# Patient Record
Sex: Male | Born: 1965 | Race: White | Hispanic: No | Marital: Single | Smoking: Current every day smoker
Health system: Southern US, Community
[De-identification: ages and names within clinical notes are randomized; demographics above are authoritative.]

## PROBLEM LIST (undated history)

## (undated) DIAGNOSIS — I10 Essential (primary) hypertension: Secondary | ICD-10-CM

## (undated) DIAGNOSIS — K219 Gastro-esophageal reflux disease without esophagitis: Secondary | ICD-10-CM

## (undated) DIAGNOSIS — F32A Depression, unspecified: Secondary | ICD-10-CM

## (undated) DIAGNOSIS — F419 Anxiety disorder, unspecified: Secondary | ICD-10-CM

## (undated) DIAGNOSIS — M549 Dorsalgia, unspecified: Secondary | ICD-10-CM

## (undated) DIAGNOSIS — N200 Calculus of kidney: Secondary | ICD-10-CM

## (undated) DIAGNOSIS — F329 Major depressive disorder, single episode, unspecified: Secondary | ICD-10-CM

## (undated) HISTORY — PX: APPENDECTOMY: SHX54

## (undated) HISTORY — PX: KNEE ARTHROSCOPY W/ MENISCAL REPAIR: SHX1877

---

## 2015-02-25 ENCOUNTER — Emergency Department (HOSPITAL_COMMUNITY)
Admission: EM | Admit: 2015-02-25 | Discharge: 2015-02-25 | Disposition: A | Discharge status: Home / Self Care | Payer: BLUE CROSS/BLUE SHIELD | Source: Home / Self Care | Attending: Family Medicine | Admitting: Family Medicine

## 2015-02-25 ENCOUNTER — Encounter (HOSPITAL_COMMUNITY): Payer: Self-pay | Admitting: Emergency Medicine

## 2015-02-25 DIAGNOSIS — M545 Low back pain, unspecified: Secondary | ICD-10-CM

## 2015-02-25 MED ORDER — KETOROLAC TROMETHAMINE 60 MG/2ML IM SOLN
INTRAMUSCULAR | Status: AC
  Filled 2015-02-25: qty 2

## 2015-02-25 MED ORDER — METHYLPREDNISOLONE ACETATE 80 MG/ML IJ SUSP
80.0000 mg | Freq: Once | INTRAMUSCULAR | Status: AC
  Administered 2015-02-25: 80 mg via INTRAMUSCULAR

## 2015-02-25 MED ORDER — METHYLPREDNISOLONE ACETATE 80 MG/ML IJ SUSP
INTRAMUSCULAR | Status: AC
  Filled 2015-02-25: qty 1

## 2015-02-25 MED ORDER — KETOROLAC TROMETHAMINE 60 MG/2ML IM SOLN
60.0000 mg | Freq: Once | INTRAMUSCULAR | Status: AC
  Administered 2015-02-25: 60 mg via INTRAMUSCULAR

## 2015-02-25 MED ORDER — HYDROCODONE-ACETAMINOPHEN 5-325 MG PO TABS: 1.0000 | ORAL_TABLET | Freq: Four times a day (QID) | ORAL | Status: DC | PRN

## 2015-02-25 MED ORDER — MELOXICAM 15 MG PO TABS: 15.0000 mg | ORAL_TABLET | Freq: Every day | ORAL | Status: DC

## 2015-02-25 MED ORDER — CYCLOBENZAPRINE HCL 10 MG PO TABS: 10.0000 mg | ORAL_TABLET | Freq: Every day | ORAL | Status: DC

## 2015-02-25 MED ORDER — TETANUS-DIPHTH-ACELL PERTUSSIS 5-2.5-18.5 LF-MCG/0.5 IM SUSP: 0.5000 mL | Freq: Once | INTRAMUSCULAR | Status: DC
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
## 2015-02-25 NOTE — Discharge Instructions (Signed)
Rest for 48 hours with gentle leg range of motion.  Think about taking up swimming several times a week to keep this from recurring in the future.

## 2015-02-25 NOTE — ED Notes (Signed)
C/o lower back pain onset 1-3 hours ago Reports he felt a pull when getting out of shower and bending over Taking Ibup 800 mg and left flexeril w/no relief Alert, no signs of acute distress.

## 2015-09-19 ENCOUNTER — Encounter (HOSPITAL_COMMUNITY): Payer: Self-pay | Admitting: Emergency Medicine

## 2015-09-19 ENCOUNTER — Emergency Department (HOSPITAL_COMMUNITY)
Admission: EM | Admit: 2015-09-19 | Discharge: 2015-09-19 | Disposition: A | Discharge status: Home / Self Care | Payer: BLUE CROSS/BLUE SHIELD | Source: Home / Self Care | Attending: Family Medicine | Admitting: Family Medicine

## 2015-09-19 DIAGNOSIS — M545 Low back pain, unspecified: Secondary | ICD-10-CM

## 2015-09-19 HISTORY — DX: Essential (primary) hypertension: I10

## 2015-09-19 HISTORY — DX: Gastro-esophageal reflux disease without esophagitis: K21.9

## 2015-09-19 HISTORY — DX: Major depressive disorder, single episode, unspecified: F32.9

## 2015-09-19 HISTORY — DX: Anxiety disorder, unspecified: F41.9

## 2015-09-19 HISTORY — DX: Dorsalgia, unspecified: M54.9

## 2015-09-19 HISTORY — DX: Depression, unspecified: F32.A

## 2015-09-19 MED ORDER — CYCLOBENZAPRINE HCL 5 MG PO TABS: 5.0000 mg | ORAL_TABLET | Freq: Three times a day (TID) | ORAL | Status: DC | PRN

## 2015-09-19 MED ORDER — KETOROLAC TROMETHAMINE 60 MG/2ML IM SOLN
INTRAMUSCULAR | Status: AC
  Filled 2015-09-19: qty 2

## 2015-09-19 MED ORDER — KETOROLAC TROMETHAMINE 60 MG/2ML IM SOLN
60.0000 mg | Freq: Once | INTRAMUSCULAR | Status: AC
  Administered 2015-09-19: 60 mg via INTRAMUSCULAR

## 2015-09-19 MED ORDER — MELOXICAM 7.5 MG PO TABS: 7.5000 mg | ORAL_TABLET | Freq: Two times a day (BID) | ORAL | Status: DC

## 2015-09-19 NOTE — Discharge Instructions (Signed)
Use medicine as prescribed and see orthopedist if further problems. °

## 2015-09-19 NOTE — ED Provider Notes (Signed)
CSN: [id number]     Arrival date & time 09/19/15  1339 History   First MD Initiated Contact with Patient 09/19/15 1537     Chief Complaint  Patient presents with  . Back Pain   (Consider location/radiation/quality/duration/timing/severity/associated sxs/prior Treatment) Patient is a 49 y.o. male presenting with back pain. The history is provided by the patient.  Back Pain Location:  Lumbar spine Quality:  Stiffness and cramping Radiates to:  Does not radiate Pain severity:  Mild Onset quality:  Sudden Duration:  1 day Progression:  Worsening Chronicity:  Chronic Context: physical stress   Context comment:  Sitting a lot at school. Relieved by:  None tried Worsened by:  Nothing tried Ineffective treatments:  None tried Associated symptoms: no abdominal pain, no abdominal swelling, no bladder incontinence, no bowel incontinence, no fever, no leg pain, no paresthesias, no pelvic pain and no perianal numbness   Risk factors: lack of exercise     Past Medical History  Diagnosis Date  . Hypertension   . Back pain   . Depression   . Anxiety   . GERD (gastroesophageal reflux disease)    Past Surgical History  Procedure Laterality Date  . Appendectomy    . Knee arthroscopy w/ meniscal repair Bilateral    No family history on file. Social History  Substance Use Topics  . Smoking status: Current Every Day Smoker  . Smokeless tobacco: None  . Alcohol Use: No     Comment: vaporizes with nicotine    Review of Systems  Constitutional: Negative.  Negative for fever.  Gastrointestinal: Negative.  Negative for abdominal pain and bowel incontinence.  Genitourinary: Negative.  Negative for bladder incontinence and pelvic pain.  Musculoskeletal: Positive for back pain. Negative for joint swelling and gait problem.  Skin: Negative.   Neurological: Negative.  Negative for paresthesias.  All other systems reviewed and are negative.   Allergies  Contrast media and Sulfa  antibiotics  Home Medications   Prior to Admission medications   Medication Sig Start Date End Date Taking? Authorizing Provider  amitriptyline (ELAVIL) 50 MG tablet Take 50 mg by mouth at bedtime.    Historical Provider, MD  aspirin EC 81 MG tablet Take 81 mg by mouth daily.    Historical Provider, MD  clonazePAM (KLONOPIN) 0.5 MG tablet Take 0.5 mg by mouth 2 (two) times daily as needed for anxiety.    Historical Provider, MD  cyclobenzaprine (FLEXERIL) 10 MG tablet Take 1 tablet (10 mg total) by mouth at bedtime. 02/25/15   Elvina SidleKurt Lauenstein, MD  HYDROcodone-acetaminophen (NORCO) 5-325 MG per tablet Take 1 tablet by mouth every 6 (six) hours as needed for moderate pain. 02/25/15   Elvina SidleKurt Lauenstein, MD  lisinopril (PRINIVIL,ZESTRIL) 10 MG tablet Take 10 mg by mouth daily.    Historical Provider, MD  meloxicam (MOBIC) 15 MG tablet Take 1 tablet (15 mg total) by mouth daily. 02/25/15   Elvina SidleKurt Lauenstein, MD  omeprazole (PRILOSEC) 40 MG capsule Take 40 mg by mouth daily.    Historical Provider, MD  venlafaxine XR (EFFEXOR-XR) 150 MG 24 hr capsule Take 150 mg by mouth daily with breakfast.    Historical Provider, MD   Meds Ordered and Administered this Visit  Medications - No data to display  BP 126/85 mmHg  Pulse 105  Temp(Src) 97.7 F (36.5 C) (Oral)  Resp 16  SpO2 97% No data found.   Physical Exam  Constitutional: He is oriented to person, place, and time. He appears well-developed and  well-nourished.  Abdominal: Soft. Bowel sounds are normal.  Musculoskeletal: He exhibits tenderness.       Lumbar back: He exhibits decreased range of motion, tenderness, bony tenderness, pain and spasm. He exhibits normal pulse.       Back:  Neurological: He is alert and oriented to person, place, and time.  Skin: Skin is warm and dry.  Nursing note and vitals reviewed.   ED Course  Procedures (including critical care time)  Labs Review Labs Reviewed - No data to display  Imaging Review No  results found.   Visual Acuity Review  Right Eye Distance:   Left Eye Distance:   Bilateral Distance:    Right Eye Near:   Left Eye Near:    Bilateral Near:         MDM  No diagnosis found. rx for mobic and flexeril.    Linna Hoff, MD 09/19/15 [phone]

## 2015-09-19 NOTE — ED Notes (Signed)
Reports recurrent back spasms.  Onset this morning of this episode of back pain.  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  Patient lives in [place], but has some physicians in [place].

## 2016-05-21 ENCOUNTER — Ambulatory Visit (HOSPITAL_COMMUNITY)
Admission: EM | Admit: 2016-05-21 | Discharge: 2016-05-21 | Disposition: A | Discharge status: Nursing Home (Includes ICF) | Payer: BLUE CROSS/BLUE SHIELD | Attending: Family Medicine | Admitting: Family Medicine

## 2016-05-21 ENCOUNTER — Emergency Department (HOSPITAL_COMMUNITY): Payer: BLUE CROSS/BLUE SHIELD

## 2016-05-21 ENCOUNTER — Encounter (HOSPITAL_COMMUNITY): Payer: Self-pay | Admitting: *Deleted

## 2016-05-21 ENCOUNTER — Encounter (HOSPITAL_COMMUNITY): Payer: Self-pay | Admitting: Emergency Medicine

## 2016-05-21 ENCOUNTER — Emergency Department (HOSPITAL_COMMUNITY)
Admission: EM | Admit: 2016-05-21 | Discharge: 2016-05-22 | Disposition: A | Discharge status: Home / Self Care | Payer: BLUE CROSS/BLUE SHIELD | Attending: Emergency Medicine | Admitting: Emergency Medicine

## 2016-05-21 DIAGNOSIS — M545 Low back pain, unspecified: Secondary | ICD-10-CM

## 2016-05-21 DIAGNOSIS — Z7982 Long term (current) use of aspirin: Secondary | ICD-10-CM | POA: Insufficient documentation

## 2016-05-21 DIAGNOSIS — Z79899 Other long term (current) drug therapy: Secondary | ICD-10-CM | POA: Insufficient documentation

## 2016-05-21 DIAGNOSIS — R109 Unspecified abdominal pain: Secondary | ICD-10-CM

## 2016-05-21 DIAGNOSIS — F17200 Nicotine dependence, unspecified, uncomplicated: Secondary | ICD-10-CM | POA: Diagnosis not present

## 2016-05-21 DIAGNOSIS — I10 Essential (primary) hypertension: Secondary | ICD-10-CM | POA: Insufficient documentation

## 2016-05-21 DIAGNOSIS — F329 Major depressive disorder, single episode, unspecified: Secondary | ICD-10-CM | POA: Diagnosis not present

## 2016-05-21 HISTORY — DX: Calculus of kidney: N20.0

## 2016-05-21 LAB — URINALYSIS, ROUTINE W REFLEX MICROSCOPIC
Bilirubin Urine: NEGATIVE
GLUCOSE, UA: NEGATIVE mg/dL
HGB URINE DIPSTICK: NEGATIVE
Ketones, ur: NEGATIVE mg/dL
Leukocytes, UA: NEGATIVE
Nitrite: NEGATIVE
Protein, ur: NEGATIVE mg/dL
SPECIFIC GRAVITY, URINE: 1.017 (ref 1.005–1.030)
pH: 5.5 (ref 5.0–8.0)

## 2016-05-21 LAB — CBC
HEMATOCRIT: 42.8 % (ref 39.0–52.0)
Hemoglobin: 14.8 g/dL (ref 13.0–17.0)
MCH: 30.8 pg (ref 26.0–34.0)
MCHC: 34.6 g/dL (ref 30.0–36.0)
MCV: 89 fL (ref 78.0–100.0)
PLATELETS: 247 10*3/uL (ref 150–400)
RBC: 4.81 MIL/uL (ref 4.22–5.81)
RDW: 12.4 % (ref 11.5–15.5)
WBC: 7.5 10*3/uL (ref 4.0–10.5)

## 2016-05-21 LAB — COMPREHENSIVE METABOLIC PANEL
ALBUMIN: 4.1 g/dL (ref 3.5–5.0)
ALT: 19 U/L (ref 17–63)
ANION GAP: 7 (ref 5–15)
AST: 21 U/L (ref 15–41)
Alkaline Phosphatase: 40 U/L (ref 38–126)
BUN: 17 mg/dL (ref 6–20)
CHLORIDE: 104 mmol/L (ref 101–111)
CO2: 26 mmol/L (ref 22–32)
Calcium: 9.5 mg/dL (ref 8.9–10.3)
Creatinine, Ser: 1.02 mg/dL (ref 0.61–1.24)
GFR calc Af Amer: >60 mL/min (ref >60)
GFR calc non Af Amer: >60 mL/min (ref >60)
GLUCOSE: 92 mg/dL (ref 65–99)
Potassium: 4.5 mmol/L (ref 3.5–5.1)
SODIUM: 137 mmol/L (ref 135–145)
Total Bilirubin: 0.5 mg/dL (ref 0.3–1.2)
Total Protein: 7 g/dL (ref 6.5–8.1)

## 2016-05-21 LAB — POCT URINALYSIS DIP (DEVICE)
BILIRUBIN URINE: NEGATIVE
Glucose, UA: NEGATIVE mg/dL
Hgb urine dipstick: NEGATIVE
Ketones, ur: NEGATIVE mg/dL
Leukocytes, UA: NEGATIVE
NITRITE: NEGATIVE
PH: 5.5 (ref 5.0–8.0)
PROTEIN: NEGATIVE mg/dL
Specific Gravity, Urine: 1.025 (ref 1.005–1.030)
UROBILINOGEN UA: 0.2 mg/dL (ref 0.0–1.0)

## 2016-05-21 MED ORDER — HYDROMORPHONE HCL 1 MG/ML IJ SOLN
1.0000 mg | Freq: Once | INTRAMUSCULAR | Status: AC
  Administered 2016-05-21: 1 mg via INTRAVENOUS
  Filled 2016-05-21: qty 1

## 2016-05-21 MED ORDER — ONDANSETRON HCL 4 MG/2ML IJ SOLN
4.0000 mg | Freq: Once | INTRAMUSCULAR | Status: AC
  Administered 2016-05-21: 4 mg via INTRAVENOUS
  Filled 2016-05-21: qty 2

## 2016-05-21 MED ORDER — KETOROLAC TROMETHAMINE 60 MG/2ML IM SOLN
INTRAMUSCULAR | Status: AC
  Filled 2016-05-21: qty 2

## 2016-05-21 MED ORDER — KETOROLAC TROMETHAMINE 60 MG/2ML IM SOLN
60.0000 mg | Freq: Once | INTRAMUSCULAR | Status: AC
  Administered 2016-05-21: 60 mg via INTRAMUSCULAR

## 2016-05-21 MED ORDER — SODIUM CHLORIDE 0.9 % IV SOLN
Freq: Once | INTRAVENOUS | Status: AC
  Administered 2016-05-21: via INTRAVENOUS

## 2016-05-21 NOTE — ED Provider Notes (Signed)
[facility]    CSN: [id number] Arrival date & time: 05/21/16  1531  First Provider Contact:  First MD Initiated Contact with Patient 05/21/16 1659        History   Chief Complaint Chief Complaint  Patient presents with  . Flank Pain    HPI Ryan Duffy is a 50 y.o. male.    Flank Pain  This is a new problem. The current episode started 6 to 12 hours ago (h/o 3 kidney stones, last in 2009, [place] The problem has been gradually worsening. Associated symptoms include abdominal pain.    Past Medical History:  Diagnosis Date  . Anxiety   . Back pain   . Depression   . GERD (gastroesophageal reflux disease)   . Hypertension     There are no active problems to display for this patient.   Past Surgical History:  Procedure Laterality Date  . APPENDECTOMY    . KNEE ARTHROSCOPY W/ MENISCAL REPAIR Bilateral        Home Medications    Prior to Admission medications   Medication Sig Start Date End Date Taking? Authorizing Provider  amitriptyline (ELAVIL) 50 MG tablet Take 50 mg by mouth at bedtime.    Historical Provider, MD  aspirin EC 81 MG tablet Take 81 mg by mouth daily.    Historical Provider, MD  clonazePAM (KLONOPIN) 0.5 MG tablet Take 0.5 mg by mouth 2 (two) times daily as needed for anxiety.    Historical Provider, MD  cyclobenzaprine (FLEXERIL) 5 MG tablet Take 1 tablet (5 mg total) by mouth 3 (three) times daily as needed for muscle spasms. 09/19/15   Linna Hoff, MD  HYDROcodone-acetaminophen (NORCO) 5-325 MG per tablet Take 1 tablet by mouth every 6 (six) hours as needed for moderate pain. 02/25/15   Elvina Sidle, MD  lisinopril (PRINIVIL,ZESTRIL) 10 MG tablet Take 10 mg by mouth daily.    Historical Provider, MD  meloxicam (MOBIC) 7.5 MG tablet Take 1 tablet (7.5 mg total) by mouth 2 (two) times daily after a meal. 09/19/15   Linna Hoff, MD  omeprazole (PRILOSEC) 40 MG capsule Take 40 mg by mouth daily.    Historical Provider,  MD  venlafaxine XR (EFFEXOR-XR) 150 MG 24 hr capsule Take 150 mg by mouth daily with breakfast.    Historical Provider, MD    Family History History reviewed. No pertinent family history.  Social History Social History  Substance Use Topics  . Smoking status: Current Every Day Smoker  . Smokeless tobacco: Not on file  . Alcohol use No     Comment: vaporizes with nicotine     Allergies   Contrast media [iodinated diagnostic agents] and Sulfa antibiotics   Review of Systems Review of Systems  Constitutional: Negative for fever.  Gastrointestinal: Positive for abdominal pain. Negative for nausea and vomiting.  Genitourinary: Positive for flank pain. Negative for dysuria and hematuria.  All other systems reviewed and are negative.    Physical Exam Triage Vital Signs ED Triage Vitals  Enc Vitals Group     BP 05/21/16 1629 110/89     Pulse Rate 05/21/16 1629 83     Resp 05/21/16 1629 16     Temp 05/21/16 1629 98.5 F (36.9 C)     Temp Source 05/21/16 1629 Oral     SpO2 05/21/16 1629 100 %     Weight --      Height --      Head Circumference --  Peak Flow --      Pain Score 05/21/16 1639 9     Pain Loc --      Pain Edu? --      Excl. in GC? --    No data found.   Updated Vital Signs BP 110/89 (BP Location: Left Arm)   Pulse 83   Temp 98.5 F (36.9 C) (Oral)   Resp 16   SpO2 100%   Visual Acuity Right Eye Distance:   Left Eye Distance:   Bilateral Distance:    Right Eye Near:   Left Eye Near:    Bilateral Near:     Physical Exam  Constitutional: He is oriented to person, place, and time. He appears well-developed and well-nourished. He appears distressed.  Neck: Normal range of motion. Neck supple.  Cardiovascular: Regular rhythm and normal heart sounds.   Pulmonary/Chest: Effort normal and breath sounds normal.  Abdominal: Soft. Bowel sounds are normal. He exhibits no mass. There is tenderness. There is CVA tenderness. There is no rigidity and  no guarding.  Lymphadenopathy:    He has no cervical adenopathy.  Neurological: He is alert and oriented to person, place, and time.  Skin: Skin is warm and dry.  Nursing note and vitals reviewed.    UC Treatments / Results  Labs (all labs ordered are listed, but only abnormal results are displayed) Labs Reviewed  POCT URINALYSIS DIP (DEVICE)    EKG  EKG Interpretation None       Radiology No results found.  Procedures Procedures (including critical care time)  Medications Ordered in UC Medications - No data to display   Initial Impression / Assessment and Plan / UC Course  I have reviewed the triage vital signs and the nursing notes.  Pertinent labs & imaging results that were available during my care of the patient were reviewed by me and considered in my medical decision making (see chart for details).  Clinical Course  flank pain with neg u/a since this am, h/o 3 stones, sent for ct eval.    Final Clinical Impressions(s) / UC Diagnoses   Final diagnoses:  None    New Prescriptions New Prescriptions   No medications on file     Linna Hoff, MD 05/21/16 1715

## 2016-05-21 NOTE — ED Provider Notes (Signed)
[facility] Note   CSN: [id number] Arrival date & time: 05/21/16  1803  First Provider Contact:  First MD Initiated Contact with Patient 05/21/16 2205        History   Chief Complaint Chief Complaint  Patient presents with  . Flank Pain    HPI Ryan Duffy is a 50 y.o. male.  This a 50 year old male with a history of kidney stones almost always on the left .  He has never been able to pass them on his own.  I was requiring surgical intervention.  Today, developed right flank pain that radiates to his right upper abdomen causes nausea.  The pain waxes and wanes in intensity but never goes away.  He went to urgent care who evaluated him, gave him an injection of Toradol and sent him to the ED for further evaluation.      Past Medical History:  Diagnosis Date  . Anxiety   . Back pain   . Depression   . GERD (gastroesophageal reflux disease)   . Hypertension   . Renal stones     There are no active problems to display for this patient.   Past Surgical History:  Procedure Laterality Date  . APPENDECTOMY    . KNEE ARTHROSCOPY W/ MENISCAL REPAIR Bilateral        Home Medications    Prior to Admission medications   Medication Sig Start Date End Date Taking? Authorizing Provider  amitriptyline (ELAVIL) 50 MG tablet Take 50 mg by mouth at bedtime.    Historical Provider, MD  aspirin EC 81 MG tablet Take 81 mg by mouth daily.    Historical Provider, MD  clonazePAM (KLONOPIN) 0.5 MG tablet Take 0.5 mg by mouth 2 (two) times daily as needed for anxiety.    Historical Provider, MD  cyclobenzaprine (FLEXERIL) 5 MG tablet Take 1 tablet (5 mg total) by mouth 3 (three) times daily as needed for muscle spasms. 09/19/15   Linna Hoff, MD  diclofenac (VOLTAREN) 75 MG EC tablet Take 1 tablet (75 mg total) by mouth 2 (two) times daily. 05/22/16   Earley Favor, NP  HYDROcodone-acetaminophen (NORCO) 5-325 MG tablet Take 1 tablet by mouth every 6 (six) hours as needed  for moderate pain. 05/22/16   Earley Favor, NP  HYDROcodone-acetaminophen (NORCO/VICODIN) 5-325 MG tablet Take 1 tablet by mouth every 6 (six) hours as needed for severe pain. 05/22/16   Earley Favor, NP  lisinopril (PRINIVIL,ZESTRIL) 10 MG tablet Take 10 mg by mouth daily.    Historical Provider, MD  meloxicam (MOBIC) 7.5 MG tablet Take 1 tablet (7.5 mg total) by mouth 2 (two) times daily after a meal. 09/19/15   Linna Hoff, MD  metaxalone (SKELAXIN) 800 MG tablet Take 1 tablet (800 mg total) by mouth 3 (three) times daily. 05/22/16   Earley Favor, NP  omeprazole (PRILOSEC) 40 MG capsule Take 40 mg by mouth daily.    Historical Provider, MD  venlafaxine XR (EFFEXOR-XR) 150 MG 24 hr capsule Take 150 mg by mouth daily with breakfast.    Historical Provider, MD    Family History No family history on file.  Social History Social History  Substance Use Topics  . Smoking status: Current Every Day Smoker  . Smokeless tobacco: Not on file  . Alcohol use No     Comment: vaporizes with nicotine     Allergies   Ivp dye [iodinated diagnostic agents]; Sulfamethoxazole; Tramadol; and Sulfa antibiotics   Review of Systems Review  of Systems  Constitutional: Negative for fever.  Gastrointestinal: Positive for abdominal pain and nausea.  Genitourinary: Positive for flank pain. Negative for hematuria.  All other systems reviewed and are negative.    Physical Exam Updated Vital Signs BP 131/91   Pulse 65   Temp 98.2 F (36.8 C) (Oral)   Resp 22   SpO2 99%   Physical Exam  Constitutional: He appears well-developed and well-nourished.  HENT:  Head: Normocephalic.  Eyes: Pupils are equal, round, and reactive to light.  Neck: Normal range of motion.  Cardiovascular: Normal rate.   Pulmonary/Chest: Effort normal.  Abdominal: Soft. He exhibits no distension. There is no tenderness.  Musculoskeletal: Normal range of motion.  Neurological: He is alert.  Skin: Skin is warm.  Psychiatric: He has  a normal mood and affect.  Nursing note and vitals reviewed.    ED Treatments / Results  Labs (all labs ordered are listed, but only abnormal results are displayed) Labs Reviewed  CBC  COMPREHENSIVE METABOLIC PANEL  URINALYSIS, ROUTINE W REFLEX MICROSCOPIC (NOT AT [facility])    EKG  EKG Interpretation None       Radiology Ct Renal Stone Study  Result Date: 05/22/2016 CLINICAL DATA:  Right flank pain with nausea for 1 day. EXAM: CT ABDOMEN AND PELVIS WITHOUT CONTRAST TECHNIQUE: Multidetector CT imaging of the abdomen and pelvis was performed following the standard protocol without IV contrast. COMPARISON:  None. FINDINGS: Lower chest:  The included lung bases are clear. Liver: No focal lesion allowing for lack contrast. Hepatobiliary: Gallbladder physiologically distended, no calcified stone. No biliary dilatation. Pancreas: No ductal dilatation or inflammation. Spleen: Normal. Adrenal glands: No nodule. Kidneys: Symmetric in size without stones or hydronephrosis. There is no perinephric stranding. Both ureters are decompressed without stones along the course. Stomach/Bowel: Stomach physiologically distended. There are no dilated or thickened small bowel loops. Moderate volume of stool throughout the colon without colonic wall thickening. Post appendectomy with surgical clips. Vascular/Lymphatic: No retroperitoneal adenopathy. Abdominal aorta is normal in caliber. Trace atherosclerosis of the abdominal aorta. Reproductive: No acute abnormality. Bladder: Minimally distended, no bladder stone. Other: No free air, free fluid, or intra-abdominal fluid collection. Phlebolith in the left pelvis. Musculoskeletal: There are no acute or suspicious osseous abnormalities. Scattered degenerative disc disease in the spine. IMPRESSION: No renal stones or obstructive uropathy. No acute abnormality in the abdomen/pelvis. Electronically Signed   By: Rubye Oaks M.D.   On: 05/22/2016 00:48     Procedures Procedures (including critical care time)  Medications Ordered in ED Medications  0.9 %  sodium chloride infusion ( Intravenous New Bag/Given 05/21/16 2342)  ondansetron (ZOFRAN) injection 4 mg (4 mg Intravenous Given 05/21/16 2343)  HYDROmorphone (DILAUDID) injection 1 mg (1 mg Intravenous Given 05/21/16 2344)     Initial Impression / Assessment and Plan / ED Course  I have reviewed the triage vital signs and the nursing notes.  Pertinent labs & imaging results that were available during my care of the patient were reviewed by me and considered in my medical decision making (see chart for details).  Clinical Course     Patient's labs have been reviewed all within normal parameters.  Due to patient's history and physical examination renal stone study will be obtained.  He will be given additional pain medication, which will include Dilaudid and Zofran as he is still quite uncomfortable and nauseated Patient's CT scan reveals no stone all his other internal organs appear to be within normal parameters.  This has  been discussed with the patient.  He will be given a muscle relaxer, anti-inflammatory 10 Vicodin pills for severe pain, and he will follow-up with his primary care physician  Final Clinical Impressions(s) / ED Diagnoses   Final diagnoses:  Flank pain  Right flank pain    New Prescriptions New Prescriptions   DICLOFENAC (VOLTAREN) 75 MG EC TABLET    Take 1 tablet (75 mg total) by mouth 2 (two) times daily.   HYDROCODONE-ACETAMINOPHEN (NORCO/VICODIN) 5-325 MG TABLET    Take 1 tablet by mouth every 6 (six) hours as needed for severe pain.   METAXALONE (SKELAXIN) 800 MG TABLET    Take 1 tablet (800 mg total) by mouth 3 (three) times daily.     Earley Favor, NP 05/21/16 2232    Earley Favor, NP 05/22/16 0132    Earley Favor, NP 05/22/16 0132    Tilden Fossa, MD 05/23/16 1350

## 2016-05-21 NOTE — ED Triage Notes (Signed)
Pt  Reports  r  Flank  Pain  With  Nausea     X  About  6  Hours

## 2016-05-21 NOTE — ED Notes (Signed)
Report  Phoned  To  lori  Nurse   First   

## 2016-05-21 NOTE — ED Triage Notes (Signed)
Pt. reports right flank pain with nausea onset today , pain similar to his kidney stone in the past , denies hematuria or dysuria , no fever or chills.

## 2016-05-21 NOTE — ED Notes (Signed)
Pt. advised nurse that he is leaving and  upset due to long wait , nurse explained delay , wait time and process but left dissatisfied.

## 2016-05-22 MED ORDER — HYDROCODONE-ACETAMINOPHEN 5-325 MG PO TABS: 1.0000 | ORAL_TABLET | Freq: Four times a day (QID) | ORAL | 0 refills | Status: DC | PRN

## 2016-05-22 MED ORDER — METAXALONE 800 MG PO TABS: 800.0000 mg | ORAL_TABLET | Freq: Three times a day (TID) | ORAL | 0 refills | Status: DC

## 2016-05-22 MED ORDER — DICLOFENAC SODIUM 75 MG PO TBEC: 75.0000 mg | DELAYED_RELEASE_TABLET | Freq: Two times a day (BID) | ORAL | 0 refills | Status: DC

## 2016-05-22 NOTE — ED Notes (Signed)
Pt to CT at this time.

## 2016-05-22 NOTE — Discharge Instructions (Signed)
As discussed, your CT scan shows normal kidneys.  No renal stone normal gallbladder, pancreas, intestinal walls, stomach, bladder.  No sign of constipation.  Your blood work reveals normal results that indicating any gallbladder disease, pancreatitis, signs of infection.  Urine was without sign of infection.  I cannot explain your discomfort you're beginning an anti-inflammatory as well as a muscle relaxer that she can use on a daily basis for the next several days.  You've also been given a prescription for Vicodin that she can use for severe pain if needed.  I recommend you follow-up with your primary care physician for further evaluation

## 2016-09-05 ENCOUNTER — Ambulatory Visit (INDEPENDENT_AMBULATORY_CARE_PROVIDER_SITE_OTHER): Payer: Self-pay | Admitting: Licensed Clinical Social Worker

## 2016-09-05 DIAGNOSIS — F331 Major depressive disorder, recurrent, moderate: Secondary | ICD-10-CM

## 2016-09-10 ENCOUNTER — Ambulatory Visit (INDEPENDENT_AMBULATORY_CARE_PROVIDER_SITE_OTHER): Payer: Self-pay | Admitting: Licensed Clinical Social Worker

## 2016-09-10 DIAGNOSIS — F331 Major depressive disorder, recurrent, moderate: Secondary | ICD-10-CM

## 2016-10-03 ENCOUNTER — Ambulatory Visit: Payer: BLUE CROSS/BLUE SHIELD | Admitting: Licensed Clinical Social Worker

## 2017-04-10 ENCOUNTER — Encounter (HOSPITAL_COMMUNITY): Payer: Self-pay

## 2017-04-10 ENCOUNTER — Emergency Department (HOSPITAL_COMMUNITY): Payer: BLUE CROSS/BLUE SHIELD

## 2017-04-10 ENCOUNTER — Emergency Department (HOSPITAL_COMMUNITY)
Admission: EM | Admit: 2017-04-10 | Discharge: 2017-04-11 | Disposition: A | Discharge status: Home / Self Care | Payer: BLUE CROSS/BLUE SHIELD | Attending: Emergency Medicine | Admitting: Emergency Medicine

## 2017-04-10 DIAGNOSIS — I10 Essential (primary) hypertension: Secondary | ICD-10-CM | POA: Diagnosis not present

## 2017-04-10 DIAGNOSIS — Y939 Activity, unspecified: Secondary | ICD-10-CM | POA: Diagnosis not present

## 2017-04-10 DIAGNOSIS — F17200 Nicotine dependence, unspecified, uncomplicated: Secondary | ICD-10-CM | POA: Insufficient documentation

## 2017-04-10 DIAGNOSIS — R1010 Upper abdominal pain, unspecified: Secondary | ICD-10-CM | POA: Diagnosis not present

## 2017-04-10 DIAGNOSIS — Z79899 Other long term (current) drug therapy: Secondary | ICD-10-CM | POA: Diagnosis not present

## 2017-04-10 DIAGNOSIS — R079 Chest pain, unspecified: Secondary | ICD-10-CM | POA: Diagnosis not present

## 2017-04-10 DIAGNOSIS — R51 Headache: Secondary | ICD-10-CM | POA: Insufficient documentation

## 2017-04-10 DIAGNOSIS — S2220XA Unspecified fracture of sternum, initial encounter for closed fracture: Secondary | ICD-10-CM | POA: Diagnosis not present

## 2017-04-10 DIAGNOSIS — Y92410 Unspecified street and highway as the place of occurrence of the external cause: Secondary | ICD-10-CM | POA: Insufficient documentation

## 2017-04-10 DIAGNOSIS — Y999 Unspecified external cause status: Secondary | ICD-10-CM | POA: Insufficient documentation

## 2017-04-10 DIAGNOSIS — S29001A Unspecified injury of muscle and tendon of front wall of thorax, initial encounter: Secondary | ICD-10-CM | POA: Diagnosis present

## 2017-04-10 LAB — I-STAT CHEM 8, ED
BUN: 11 mg/dL (ref 6–20)
CALCIUM ION: 1.12 mmol/L — AB (ref 1.15–1.40)
Chloride: 99 mmol/L — ABNORMAL LOW (ref 101–111)
Creatinine, Ser: 0.9 mg/dL (ref 0.61–1.24)
GLUCOSE: 108 mg/dL — AB (ref 65–99)
HCT: 44 % (ref 39.0–52.0)
HEMOGLOBIN: 15 g/dL (ref 13.0–17.0)
Potassium: 4.2 mmol/L (ref 3.5–5.1)
Sodium: 138 mmol/L (ref 135–145)
TCO2: 27 mmol/L (ref 0–100)

## 2017-04-10 LAB — COMPREHENSIVE METABOLIC PANEL
ALBUMIN: 3.6 g/dL (ref 3.5–5.0)
ALK PHOS: 44 U/L (ref 38–126)
ALT: 33 U/L (ref 17–63)
AST: 34 U/L (ref 15–41)
Anion gap: 5 (ref 5–15)
BUN: 9 mg/dL (ref 6–20)
CALCIUM: 9.1 mg/dL (ref 8.9–10.3)
CO2: 28 mmol/L (ref 22–32)
CREATININE: 0.91 mg/dL (ref 0.61–1.24)
Chloride: 103 mmol/L (ref 101–111)
GFR calc Af Amer: >60 mL/min (ref >60)
GFR calc non Af Amer: >60 mL/min (ref >60)
Glucose, Bld: 109 mg/dL — ABNORMAL HIGH (ref 65–99)
Potassium: 4.3 mmol/L (ref 3.5–5.1)
SODIUM: 136 mmol/L (ref 135–145)
Total Bilirubin: 0.4 mg/dL (ref 0.3–1.2)
Total Protein: 7 g/dL (ref 6.5–8.1)

## 2017-04-10 LAB — CBC
HCT: 43.1 % (ref 39.0–52.0)
HEMOGLOBIN: 14.6 g/dL (ref 13.0–17.0)
MCH: 29.5 pg (ref 26.0–34.0)
MCHC: 33.9 g/dL (ref 30.0–36.0)
MCV: 87.1 fL (ref 78.0–100.0)
Platelets: 266 10*3/uL (ref 150–400)
RBC: 4.95 MIL/uL (ref 4.22–5.81)
RDW: 12.7 % (ref 11.5–15.5)
WBC: 12.6 10*3/uL — ABNORMAL HIGH (ref 4.0–10.5)

## 2017-04-10 MED ORDER — ONDANSETRON HCL 4 MG/2ML IJ SOLN
4.0000 mg | Freq: Once | INTRAMUSCULAR | Status: AC
  Administered 2017-04-10: 4 mg via INTRAVENOUS
  Filled 2017-04-10: qty 2

## 2017-04-10 MED ORDER — DIPHENHYDRAMINE HCL 50 MG/ML IJ SOLN
50.0000 mg | Freq: Once | INTRAMUSCULAR | Status: AC
  Administered 2017-04-11: 50 mg via INTRAVENOUS
  Filled 2017-04-10: qty 1

## 2017-04-10 MED ORDER — HYDROMORPHONE HCL 1 MG/ML IJ SOLN
1.0000 mg | Freq: Once | INTRAMUSCULAR | Status: AC
  Administered 2017-04-10: 1 mg via INTRAVENOUS
  Filled 2017-04-10: qty 1

## 2017-04-10 MED ORDER — HYDROCORTISONE NA SUCCINATE PF 100 MG IJ SOLR: 400.0000 mg | Freq: Once | INTRAMUSCULAR | Status: DC

## 2017-04-10 MED ORDER — HYDROCORTISONE NA SUCCINATE PF 250 MG IJ SOLR
200.0000 mg | Freq: Once | INTRAMUSCULAR | Status: AC
  Administered 2017-04-10: 200 mg via INTRAVENOUS
  Filled 2017-04-10: qty 200

## 2017-04-10 MED ORDER — DIPHENHYDRAMINE HCL 25 MG PO CAPS: 50.0000 mg | ORAL_CAPSULE | Freq: Once | ORAL | Status: AC

## 2017-04-10 NOTE — ED Notes (Addendum)
Dr. Patria Maneampos Cleared pt's C-Spine and told RN to remove C-Collar
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
## 2017-04-10 NOTE — ED Notes (Signed)
Patient transported to CT 

## 2017-04-10 NOTE — ED Notes (Signed)
Pain med given 

## 2017-04-11 ENCOUNTER — Emergency Department (HOSPITAL_COMMUNITY): Payer: BLUE CROSS/BLUE SHIELD

## 2017-04-11 DIAGNOSIS — S2220XA Unspecified fracture of sternum, initial encounter for closed fracture: Secondary | ICD-10-CM | POA: Diagnosis not present

## 2017-04-11 MED ORDER — OXYCODONE-ACETAMINOPHEN 5-325 MG PO TABS: 1.0000 | ORAL_TABLET | Freq: Four times a day (QID) | ORAL | 0 refills | Status: DC | PRN

## 2017-04-11 MED ORDER — IOPAMIDOL (ISOVUE-300) INJECTION 61%
INTRAVENOUS | Status: AC
  Administered 2017-04-11: 100 mL
  Filled 2017-04-11: qty 100

## 2017-04-11 MED ORDER — OXYCODONE-ACETAMINOPHEN 5-325 MG PO TABS
1.0000 | ORAL_TABLET | Freq: Once | ORAL | Status: AC
  Administered 2017-04-11: 1 via ORAL
  Filled 2017-04-11: qty 1

## 2017-04-11 MED ORDER — MELOXICAM 15 MG PO TABS: 15.0000 mg | ORAL_TABLET | Freq: Every day | ORAL | 0 refills | Status: DC
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
## 2017-04-11 NOTE — ED Notes (Signed)
Pt understood dc material. Scripts given at dc. NAD noted 

## 2018-01-27 ENCOUNTER — Encounter: Payer: Self-pay | Admitting: Neurology

## 2018-02-14 ENCOUNTER — Encounter: Payer: Self-pay | Admitting: Neurology

## 2018-05-12 ENCOUNTER — Ambulatory Visit (INDEPENDENT_AMBULATORY_CARE_PROVIDER_SITE_OTHER): Payer: Commercial Managed Care - PPO | Admitting: Neurology

## 2018-05-12 ENCOUNTER — Other Ambulatory Visit: Payer: Self-pay

## 2018-05-12 ENCOUNTER — Encounter: Payer: Self-pay | Admitting: Neurology

## 2018-05-12 VITALS — BP 118/76 | HR 114 | Ht 70.0 in | Wt 186.0 lb

## 2018-05-12 DIAGNOSIS — M545 Low back pain, unspecified: Secondary | ICD-10-CM

## 2018-05-12 DIAGNOSIS — G8929 Other chronic pain: Secondary | ICD-10-CM | POA: Diagnosis not present
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
## 2018-05-12 NOTE — Patient Instructions (Signed)
I don't think we are missing anything in regards to some peripheral nerve causing the pain.  I don't think a nerve conduction study will be helpful  I would continue working with pain management.  I will refer you to Dr. Antoine PrimasZachary Smith at [facility] who does perform OMM, which I think would be helpful.  Follow up as needed.
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
## 2018-06-02 ENCOUNTER — Encounter: Payer: Self-pay | Admitting: Family Medicine

## 2018-06-02 ENCOUNTER — Ambulatory Visit (INDEPENDENT_AMBULATORY_CARE_PROVIDER_SITE_OTHER): Payer: Commercial Managed Care - PPO | Admitting: Family Medicine

## 2018-06-02 VITALS — BP 110/82 | HR 115 | Ht 70.0 in | Wt 187.0 lb

## 2018-06-02 DIAGNOSIS — M533 Sacrococcygeal disorders, not elsewhere classified: Secondary | ICD-10-CM | POA: Insufficient documentation

## 2018-06-02 DIAGNOSIS — M999 Biomechanical lesion, unspecified: Secondary | ICD-10-CM | POA: Diagnosis not present

## 2018-06-02 NOTE — Assessment & Plan Note (Signed)
Sacroiliac Joint Mobilization and Rehab 1. Work on pretzel stretching, shoulder back and leg draped in front. 3-5 sets, 30 sec.. 2. hip abductor rotations. standing, hip flexion and rotation outward then inward. 3 sets, 15 reps. when can do comfortably, add ankle weights starting at 2 pounds.  3. cross over stretching - shoulder back to ground, same side leg crossover. 3-5 sets for 30 min..  4. rolling up and back knees to chest and rocking. 5. sacral tilt - 5 sets, hold for 5-10 seconds Return to clinic in 4 weeks 

## 2018-06-02 NOTE — Assessment & Plan Note (Signed)
Decision today to treat with OMT was based on Physical Exam  After verbal consent patient was treated with HVLA, ME, FPR techniques in cervical, thoracic, lumbar and sacral areas  Patient tolerated the procedure well with improvement in symptoms  Patient given exercises, stretches and lifestyle modifications  See medications in patient instructions if given  Patient will follow up in 4-6 weeks 

## 2018-06-02 NOTE — Patient Instructions (Addendum)
Good to see you.  Ice 20 minutes 2 times daily. Usually after activity and before bed. Exercises 3 times a week.  Keep monitor at eye level Tennis ball between shoulder blades with sitting.  Vitamin D 2000 IU daily  turmeric 500 mg daily  See me again in 4 weeks

## 2018-06-30 ENCOUNTER — Ambulatory Visit: Payer: Self-pay | Admitting: Family Medicine

## 2018-07-04 NOTE — Progress Notes (Signed)
[facility] [address] [place], [place] [address] Phone: [phone] Subjective:    I [name] am serving as a Neurosurgeon for Dr. Antoine Primas.   I'm seeing this patient by the request  of:  CC: Right sacroiliac follow-up  BJY:NWGNFAOZHY  Ryan Duffy is a 52 y.o. male coming in with complaint of SI joint pain. States that he feels about the same today. Most of his pain is on the right.  We attempted osteopathic manipulation with no significant benefit.  Patient does state that he has had some difficulty though working out on a regular basis and did not do the exercises regularly but most of this was secondary to stress and pain.      Previous imaging I have the MRI from July 2018 Mild degenerative disc disease with posterior broad-based disc bulge resulting in moderate left and mild right lateral recess stenosis at L5-S1 with disc material contacting the descending left S1 nerve root within the lateral recess.   No substantial central or foraminal stenosis.  Past Medical History:  Diagnosis Date  . Anxiety   . Back pain   . Depression   . GERD (gastroesophageal reflux disease)   . Hypertension   . Renal stones    Past Surgical History:  Procedure Laterality Date  . APPENDECTOMY    . KNEE ARTHROSCOPY W/ MENISCAL REPAIR Bilateral    Social History   Socioeconomic History  . Marital status: Single    Spouse name: Not on file  . Number of children: Not on file  . Years of education: Not on file  . Highest education level: Master's degree (e.g., MA, MS, MEng, MEd, MSW, MBA)  Occupational History  . 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 Financial resource strain: Not on file  . Food insecurity:    Worry: Not on file    Inability: Not on file  . Transportation needs:    Medical: Not on file    Non-medical: Not on file  Tobacco Use  . Smoking status: Current Every Day Smoker    Types: E-cigarettes  .  Smokeless tobacco: Never Used  Substance and Sexual Activity  . Alcohol use: No    Comment: vaporizes with nicotine  . Drug use: No  . Sexual activity: Not on file  Lifestyle  . Physical activity:    Days per week: Not on file    Minutes per session: Not on file  . Stress: Not on file  Relationships  . Social connections:    Talks on phone: Not on file    Gets together: Not on file    Attends religious service: Not on file    Active member of club or organization: Not on file    Attends meetings of clubs or organizations: Not on file    Relationship status: Not on file  Other Topics Concern  . Not on file  Social History Narrative   Patient is right-handed. He lives with a roommate in a one story house. He drinks 40 oz of coffee a day and occasionally tea. He does not exercise.   Allergies  Allergen Reactions  . Ivp Dye [Iodinated Diagnostic Agents] Anaphylaxis  . Sulfamethoxazole Rash    Other reaction(s): Other (See Comments) [Rash] burning blisters  . Tramadol Nausea And Vomiting  . Sulfa Antibiotics Rash   Family History  Problem Relation Age of Onset  . Hypertension Mother   . Hypercholesterolemia Mother   .  Melanoma Mother   . Hypertension Father   . Renal Disease Father   . Heart attack Father   . CVA Father   . Transient ischemic attack Father   . Hypercholesterolemia Father   . HIV Brother      Current Outpatient Medications (Cardiovascular):  .  lisinopril (PRINIVIL,ZESTRIL) 10 MG tablet, Take 10 mg by mouth daily.  Current Outpatient Medications (Respiratory):  .  fluticasone (FLONASE) 50 MCG/ACT nasal spray, Place 1-2 sprays into both nostrils every morning.  Current Outpatient Medications (Analgesics):  .  aspirin EC 81 MG tablet, Take 81 mg by mouth daily. Marland Kitchen  BELBUCA 450 MCG FILM, PLACE 1 FILM INSIDE CHEEKS BID .  diclofenac (VOLTAREN) 75 MG EC tablet,  .  HYDROcodone-acetaminophen (NORCO) 5-325 MG tablet, Take 1 tablet by mouth every 6 (six)  hours as needed for moderate pain. .  meloxicam (MOBIC) 15 MG tablet, Take 1 tablet (15 mg total) by mouth daily. (Patient not taking: Reported on 07/07/2018)   Current Outpatient Medications (Other):  .  amitriptyline (ELAVIL) 50 MG tablet, Take 50 mg by mouth at bedtime. .  baclofen (LIORESAL) 10 MG tablet, Take 10 mg by mouth 3 (three) times daily as needed. .  clonazePAM (KLONOPIN) 0.5 MG tablet, Take 0.5 mg by mouth 2 (two) times daily as needed for anxiety. .  multivitamin-iron-minerals-folic acid (CENTRUM) chewable tablet, Chew 1 tablet by mouth daily. Marland Kitchen  omeprazole (PRILOSEC) 40 MG capsule, Take 40 mg by mouth daily. Marland Kitchen  venlafaxine (EFFEXOR) 75 MG tablet, Take 75 mg by mouth daily. Marland Kitchen  venlafaxine XR (EFFEXOR-XR) 150 MG 24 hr capsule, Take 150 mg by mouth daily with breakfast.    Past medical history, social, surgical and family history all reviewed in electronic medical record.  No pertanent information unless stated regarding to the chief complaint.   Review of Systems:  No headache, visual changes, nausea, vomiting, diarrhea, constipation, dizziness, abdominal pain, skin rash, fevers, chills, night sweats, weight loss, swollen lymph nodes, body aches, joint swelling, chest pain, shortness of breath, mood changes.  Positive muscle aches  Objective  Blood pressure 120/72, pulse 92, height 5\' 10"  (1.778 m), weight 187 lb (84.8 kg), SpO2 98 %.   General: No apparent distress alert and oriented x3 mood and affect normal, dressed appropriately.  HEENT: Pupils equal, extraocular movements intact  Respiratory: Patient's speak in full sentences and does not appear short of breath  Cardiovascular: No lower extremity edema, non tender, no erythema  Skin: Warm dry intact with no signs of infection or rash on extremities or on axial skeleton.  Abdomen: Soft nontender  Neuro: Cranial nerves II through XII are intact, neurovascularly intact in all extremities with 2+ DTRs and 2+ pulses.    Lymph: No lymphadenopathy of posterior or anterior cervical chain or axillae bilaterally.  Gait normal with good balance and coordination.  MSK:  Non tender with full range of motion and good stability and symmetric strength and tone of shoulders, elbows, wrist, hip, knee and ankles bilaterally.  Back Exam:  Inspection: Lordosis Motion: Flexion 35 deg, Extension 25 deg, Side Bending to 35 deg bilaterally,  Rotation to 45 deg bilaterally  SLR laying: Negative  XSLR laying: Negative  Palpable tenderness: Tender to palpation the paraspinal musculature more on the right sacroiliac. FABER: Mild pain. Sensory change: Gross sensation intact to all lumbar and sacral dermatomes.  Reflexes: 2+ at both patellar tendons, 2+ at achilles tendons, Babinski's downgoing.  Strength at foot  Plantar-flexion: 5/5 Dorsi-flexion: 5/5  Eversion: 5/5 Inversion: 5/5  Leg strength  Quad: 5/5 Hamstring: 5/5 Hip flexor: 5/5 Hip abductors: 5/5  Gait unremarkable.     Procedure: Real-time Ultrasound Guided Injection of right sacroiliac joint Device: GE Logiq Q7 Ultrasound guided injection is preferred based studies that show increased duration, increased effect, greater accuracy, decreased procedural pain, increased response rate, and decreased cost with ultrasound guided versus blind injection.  Verbal informed consent obtained.  Time-out conducted.  Noted no overlying erythema, induration, or other signs of local infection.  Skin prepped in a sterile fashion.  Local anesthesia: Topical Ethyl chloride.  With sterile technique and under real time ultrasound guidance: With a 21-gauge 2 inch needle patient was injected with 1 cc of 0.5% Marcaine and 1 cc of Kenalog 40 mg/mL Completed without difficulty  Pain immediately resolved suggesting accurate placement of the medication.  Advised to call if fevers/chills, erythema, induration, drainage, or persistent bleeding.  Images permanently stored and available for  review in the ultrasound unit.  Impression: Technically successful ultrasound guided injection. Impression and Recommendations:     This case required medical decision making of moderate complexity. The above documentation has been reviewed and is accurate and complete Judi SaaZachary M Zoriah Pulice, DO       Note: This dictation was prepared with Dragon dictation along with smaller phrase technology. Any transcriptional errors that result from this process are unintentional.

## 2018-07-07 ENCOUNTER — Ambulatory Visit: Payer: Self-pay

## 2018-07-07 ENCOUNTER — Ambulatory Visit: Payer: PRIVATE HEALTH INSURANCE | Admitting: Family Medicine

## 2018-07-07 ENCOUNTER — Encounter: Payer: Self-pay | Admitting: Family Medicine

## 2018-07-07 VITALS — BP 120/72 | HR 92 | Ht 70.0 in | Wt 187.0 lb

## 2018-07-07 DIAGNOSIS — M25551 Pain in right hip: Secondary | ICD-10-CM

## 2018-07-07 DIAGNOSIS — M533 Sacrococcygeal disorders, not elsewhere classified: Secondary | ICD-10-CM

## 2018-07-07 NOTE — Assessment & Plan Note (Signed)
Patient given an injection today.  Tolerated the procedure well.  We discussed icing regimen and home exercises.  Discussed which activities of doing which wants to avoid.  Patient has had epidural steroid injections previously with no significant improvement that was long-term.  This is the first right sided sacroiliac injection given.  Patient will follow-up with me again in 4 to 6 weeks

## 2018-07-07 NOTE — Patient Instructions (Signed)
Good to see you  Lets also get a back xray today  Ice is your friend Ice 20 minutes 2 times daily. Usually after activity and before bed. Start the exercises again in 48 hours and do them 3 times a week  Glad the knew job is better See me again in 3-4 weeks

## 2018-07-17 ENCOUNTER — Encounter: Payer: Self-pay | Admitting: Family Medicine
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
## 2018-08-04 ENCOUNTER — Encounter: Payer: Self-pay | Admitting: Family Medicine

## 2018-08-04 ENCOUNTER — Ambulatory Visit: Payer: Self-pay

## 2018-08-04 ENCOUNTER — Ambulatory Visit: Payer: PRIVATE HEALTH INSURANCE | Admitting: Family Medicine

## 2018-08-04 VITALS — BP 130/82 | HR 104 | Ht 70.0 in | Wt 187.0 lb

## 2018-08-04 DIAGNOSIS — M533 Sacrococcygeal disorders, not elsewhere classified: Secondary | ICD-10-CM

## 2018-08-04 DIAGNOSIS — M25552 Pain in left hip: Secondary | ICD-10-CM | POA: Diagnosis not present

## 2018-08-04 NOTE — Patient Instructions (Signed)
Good to see you  Ice is your friend I am hoping this one makes a bigger difference.  If not we would have to consider up stream a little more but I am optimistic  Take couple day from any physical activity then ok to restart exercises  See me again in 4-6 weeks

## 2018-08-04 NOTE — Assessment & Plan Note (Signed)
Attempted injection today.  Tolerated the procedure well.  Did have numbing immediately.  Patient was feeling much better.  Hopefully this will be beneficial.  Discussed with patient about the posture, ergonomics, which activities to do which wants to avoid.  Patient is to increase activity slowly over the course of next several days.  Follow-up again in 4 to 8 weeks

## 2018-08-25 ENCOUNTER — Encounter: Payer: Self-pay | Admitting: Family Medicine

## 2018-08-29 NOTE — Progress Notes (Deleted)
[facility] [address] [place], [place] [address] Phone: [phone] Subjective:    I'm seeing this patient by the request  of:    CC:   BJY:NWGNFAOZHY  Ryan Duffy is a 52 y.o. male coming in with complaint of ***  Onset-  Location Duration-  Character- Aggravating factors- Reliving factors-  Therapies tried-  Severity-     Past Medical History:  Diagnosis Date  . Anxiety   . Back pain   . Depression   . GERD (gastroesophageal reflux disease)   . Hypertension   . Renal stones    Past Surgical History:  Procedure Laterality Date  . APPENDECTOMY    . KNEE ARTHROSCOPY W/ MENISCAL REPAIR Bilateral    Social History   Socioeconomic History  . Marital status: Single    Spouse name: Not on file  . Number of children: Not on file  . Years of education: Not on file  . Highest education level: Master's degree (e.g., MA, MS, MEng, MEd, MSW, MBA)  Occupational History  . 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 Financial resource strain: Not on file  . Food insecurity:    Worry: Not on file    Inability: Not on file  . Transportation needs:    Medical: Not on file    Non-medical: Not on file  Tobacco Use  . Smoking status: Current Every Day Smoker    Types: E-cigarettes  . Smokeless tobacco: Never Used  Substance and Sexual Activity  . Alcohol use: No    Comment: vaporizes with nicotine  . Drug use: No  . Sexual activity: Not on file  Lifestyle  . Physical activity:    Days per week: Not on file    Minutes per session: Not on file  . Stress: Not on file  Relationships  . Social connections:    Talks on phone: Not on file    Gets together: Not on file    Attends religious service: Not on file    Active member of club or organization: Not on file    Attends meetings of clubs or organizations: Not on file    Relationship status: Not on file  Other Topics Concern  . Not on file  Social  History Narrative   Patient is right-handed. He lives with a roommate in a one story house. He drinks 40 oz of coffee a day and occasionally tea. He does not exercise.   Allergies  Allergen Reactions  . Ivp Dye [Iodinated Diagnostic Agents] Anaphylaxis  . Sulfamethoxazole Rash    Other reaction(s): Other (See Comments) [Rash] burning blisters  . Tramadol Nausea And Vomiting  . Sulfa Antibiotics Rash   Family History  Problem Relation Age of Onset  . Hypertension Mother   . Hypercholesterolemia Mother   . Melanoma Mother   . Hypertension Father   . Renal Disease Father   . Heart attack Father   . CVA Father   . Transient ischemic attack Father   . Hypercholesterolemia Father   . HIV Brother      Current Outpatient Medications (Cardiovascular):  .  lisinopril (PRINIVIL,ZESTRIL) 10 MG tablet, Take 10 mg by mouth daily.  Current Outpatient Medications (Respiratory):  .  fluticasone (FLONASE) 50 MCG/ACT nasal spray, Place 1-2 sprays into both nostrils every morning.  Current Outpatient Medications (Analgesics):  .  aspirin EC 81 MG tablet, Take 81 mg by mouth daily. Marland Kitchen  BELBUCA 450 MCG  FILM, PLACE 1 FILM INSIDE CHEEKS BID .  diclofenac (VOLTAREN) 75 MG EC tablet,  .  HYDROcodone-acetaminophen (NORCO) 5-325 MG tablet, Take 1 tablet by mouth every 6 (six) hours as needed for moderate pain. (Patient not taking: Reported on 08/04/2018) .  meloxicam (MOBIC) 15 MG tablet, Take 1 tablet (15 mg total) by mouth daily. (Patient not taking: Reported on 08/04/2018) .  oxycodone-acetaminophen (LYNOX) 10-300 MG tablet, Take 1 tablet by mouth 3 (three) times daily.   Current Outpatient Medications (Other):  .  amitriptyline (ELAVIL) 50 MG tablet, Take 50 mg by mouth at bedtime. .  baclofen (LIORESAL) 10 MG tablet, Take 10 mg by mouth 3 (three) times daily as needed. .  clonazePAM (KLONOPIN) 0.5 MG tablet, Take 0.5 mg by mouth 2 (two) times daily as needed for anxiety. .   multivitamin-iron-minerals-folic acid (CENTRUM) chewable tablet, Chew 1 tablet by mouth daily. Marland Kitchen  omeprazole (PRILOSEC) 40 MG capsule, Take 40 mg by mouth daily. Marland Kitchen  venlafaxine (EFFEXOR) 75 MG tablet, Take 75 mg by mouth daily. Marland Kitchen  venlafaxine XR (EFFEXOR-XR) 150 MG 24 hr capsule, Take 150 mg by mouth daily with breakfast.    Past medical history, social, surgical and family history all reviewed in electronic medical record.  No pertanent information unless stated regarding to the chief complaint.   Review of Systems:  No headache, visual changes, nausea, vomiting, diarrhea, constipation, dizziness, abdominal pain, skin rash, fevers, chills, night sweats, weight loss, swollen lymph nodes, body aches, joint swelling, muscle aches, chest pain, shortness of breath, mood changes.   Objective  There were no vitals taken for this visit. Systems examined below as of    General: No apparent distress alert and oriented x3 mood and affect normal, dressed appropriately.  HEENT: Pupils equal, extraocular movements intact  Respiratory: Patient's speak in full sentences and does not appear short of breath  Cardiovascular: No lower extremity edema, non tender, no erythema  Skin: Warm dry intact with no signs of infection or rash on extremities or on axial skeleton.  Abdomen: Soft nontender  Neuro: Cranial nerves II through XII are intact, neurovascularly intact in all extremities with 2+ DTRs and 2+ pulses.  Lymph: No lymphadenopathy of posterior or anterior cervical chain or axillae bilaterally.  Gait normal with good balance and coordination.  MSK:  Non tender with full range of motion and good stability and symmetric strength and tone of shoulders, elbows, wrist, hip, knee and ankles bilaterally.     Impression and Recommendations:     This case required medical decision making of moderate complexity. The above documentation has been reviewed and is accurate and complete Judi Saa, DO         Note: This dictation was prepared with Dragon dictation along with smaller phrase technology. Any transcriptional errors that result from this process are unintentional.

## 2018-09-01 ENCOUNTER — Ambulatory Visit: Payer: Self-pay | Admitting: Family Medicine

## 2018-09-01 ENCOUNTER — Encounter: Payer: Self-pay | Admitting: Family Medicine

## 2018-09-23 ENCOUNTER — Encounter: Payer: Self-pay | Admitting: Family Medicine

## 2018-09-23 ENCOUNTER — Ambulatory Visit: Payer: PRIVATE HEALTH INSURANCE | Admitting: Family Medicine

## 2018-09-23 VITALS — BP 110/70 | HR 75 | Ht 70.0 in | Wt 192.0 lb

## 2018-09-23 DIAGNOSIS — G8929 Other chronic pain: Secondary | ICD-10-CM

## 2018-09-23 DIAGNOSIS — M5416 Radiculopathy, lumbar region: Secondary | ICD-10-CM | POA: Diagnosis not present

## 2018-09-23 DIAGNOSIS — M533 Sacrococcygeal disorders, not elsewhere classified: Secondary | ICD-10-CM | POA: Diagnosis not present

## 2018-09-23 NOTE — Progress Notes (Signed)
[facility] [address] [place], [place] [address] Phone: [phone] Subjective:    I Ryan Duffy am serving as a Neurosurgeon for Dr. Antoine Primas.   I'm seeing this patient by the request  of:    CC: Back and left hip pain  BJY:[id number]  Ryan Duffy is a 52 y.o. male coming in with complaint of left hip/ SI joint pain. Had injections last visit. Still in pain.  We attempted doing sacroiliac injection.  States that did have some mild relief for 48 hours and then back to the baseline.  Did not have response to epidurals but never had a nerve root injection.  Patient did have an MRI from an outside facility of the lumbar spine showing mostly discogenic nerve impingement but there is some foraminal narrowing on the left side at L3 and L4.  Patient states that this pain seems to be unrelenting with radicular symptoms into the buttocks and the lateral aspect of the hip.  Sometimes can test the knee.  Affecting daily activities.  Becoming more and more disgruntled with the pain.    Past Medical History:  Diagnosis Date  . Anxiety   . Back pain   . Depression   . GERD (gastroesophageal reflux disease)   . Hypertension   . Renal stones    Past Surgical History:  Procedure Laterality Date  . APPENDECTOMY    . KNEE ARTHROSCOPY W/ MENISCAL REPAIR Bilateral    Social History   Socioeconomic History  . Marital status: Single    Spouse name: Not on file  . Number of children: Not on file  . Years of education: Not on file  . Highest education level: Master's degree (e.g., MA, MS, MEng, MEd, MSW, MBA)  Occupational History  . 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 Financial resource strain: Not on file  . Food insecurity:    Worry: Not on file    Inability: Not on file  . Transportation needs:    Medical: Not on file    Non-medical: Not on file  Tobacco Use  . Smoking status: Current Every Day Smoker    Types:  E-cigarettes  . Smokeless tobacco: Never Used  Substance and Sexual Activity  . Alcohol use: No    Comment: vaporizes with nicotine  . Drug use: No  . Sexual activity: Not on file  Lifestyle  . Physical activity:    Days per week: Not on file    Minutes per session: Not on file  . Stress: Not on file  Relationships  . Social connections:    Talks on phone: Not on file    Gets together: Not on file    Attends religious service: Not on file    Active member of club or organization: Not on file    Attends meetings of clubs or organizations: Not on file    Relationship status: Not on file  Other Topics Concern  . Not on file  Social History Narrative   Patient is right-handed. He lives with a roommate in a one story house. He drinks 40 oz of coffee a day and occasionally tea. He does not exercise.   Allergies  Allergen Reactions  . Ivp Dye [Iodinated Diagnostic Agents] Anaphylaxis  . Sulfamethoxazole Rash    Other reaction(s): Other (See Comments) [Rash] burning blisters  . Tramadol Nausea And Vomiting  . Sulfa Antibiotics Rash   Family History  Problem Relation Age  of Onset  . Hypertension Mother   . Hypercholesterolemia Mother   . Melanoma Mother   . Hypertension Father   . Renal Disease Father   . Heart attack Father   . CVA Father   . Transient ischemic attack Father   . Hypercholesterolemia Father   . HIV Brother      Current Outpatient Medications (Cardiovascular):  .  lisinopril (PRINIVIL,ZESTRIL) 10 MG tablet, Take 10 mg by mouth daily.  Current Outpatient Medications (Respiratory):  .  fluticasone (FLONASE) 50 MCG/ACT nasal spray, Place 1-2 sprays into both nostrils every morning.  Current Outpatient Medications (Analgesics):  .  aspirin EC 81 MG tablet, Take 81 mg by mouth daily. Marland Kitchen.  BELBUCA 450 MCG FILM, PLACE 1 FILM INSIDE CHEEKS BID .  diclofenac (VOLTAREN) 75 MG EC tablet,  .  HYDROcodone-acetaminophen (NORCO) 5-325 MG tablet, Take 1 tablet by mouth  every 6 (six) hours as needed for moderate pain. .  meloxicam (MOBIC) 15 MG tablet, Take 1 tablet (15 mg total) by mouth daily. Marland Kitchen.  oxycodone-acetaminophen (LYNOX) 10-300 MG tablet, Take 1 tablet by mouth 3 (three) times daily.   Current Outpatient Medications (Other):  .  amitriptyline (ELAVIL) 50 MG tablet, Take 50 mg by mouth at bedtime. .  baclofen (LIORESAL) 10 MG tablet, Take 10 mg by mouth 3 (three) times daily as needed. .  clonazePAM (KLONOPIN) 0.5 MG tablet, Take 0.5 mg by mouth 2 (two) times daily as needed for anxiety. .  multivitamin-iron-minerals-folic acid (CENTRUM) chewable tablet, Chew 1 tablet by mouth daily. Marland Kitchen.  omeprazole (PRILOSEC) 40 MG capsule, Take 40 mg by mouth daily. Marland Kitchen.  venlafaxine (EFFEXOR) 75 MG tablet, Take 75 mg by mouth daily. Marland Kitchen.  venlafaxine XR (EFFEXOR-XR) 150 MG 24 hr capsule, Take 150 mg by mouth daily with breakfast.    Past medical history, social, surgical and family history all reviewed in electronic medical record.  No pertanent information unless stated regarding to the chief complaint.   Review of Systems:  No headache, visual changes, nausea, vomiting, diarrhea, constipation, dizziness, abdominal pain, skin rash, fevers, chills, night sweats, weight loss, swollen lymph nodes, body aches, ] chest pain, shortness of breath, mood changes.  Positive muscle aches, joint swelling  Objective  Blood pressure 110/70, pulse 75, height 5\' 10"  (1.778 m), weight 192 lb (87.1 kg), SpO2 98 %.    General: No apparent distress alert and oriented x3 mood and affect normal, dressed appropriately.  HEENT: Pupils equal, extraocular movements intact  Respiratory: Patient's speak in full sentences and does not appear short of breath  Cardiovascular: No lower extremity edema, non tender, no erythema  Skin: Warm dry intact with no signs of infection or rash on extremities or on axial skeleton.  Abdomen: Soft nontender  Neuro: Cranial nerves II through XII are intact,  neurovascularly intact in all extremities with 2+ DTRs and 2+ pulses.  Lymph: No lymphadenopathy of posterior or anterior cervical chain or axillae bilaterally.  Gait normal with good balance and coordination.  MSK:  Non tender with full range of motion and good stability and symmetric strength and tone of shoulders, elbows, wrist, hip, knee and ankles bilaterally.  Back exam still shows loss of lordosis.  Patient does have tenderness over the left sacroiliac joint in the paraspinal musculature.  Mild positive straight leg test.  4+ out of 5 strength.  Deep tendon reflexes do appear to be intact though.  Mild increase in discomfort with Pearlean BrownieFaber test as well.   Impression and  Recommendations:      The above documentation has been reviewed and is accurate and complete Lyndal Pulley, DO       Note: This dictation was prepared with Dragon dictation along with smaller phrase technology. Any transcriptional errors that result from this process are unintentional.

## 2018-09-23 NOTE — Assessment & Plan Note (Signed)
-  Left lumbar radiculopathy.  Patient's MRI from outside facility does show significant degenerative disc and likely discogenic pain but I do feel that there is some peripheral fibers that could be causing some of the discomfort and pain.  Patient did not respond as well to the epidural steroid injections previously.  We did discuss that there could be a possibility of radiofrequency ablation if we did feel a peripheral nerves are injured.  Would like to send patient to Dr. Ollen BowlHarkins for another evaluation for any other type of interventions to avoid any surgical at intervention.  Otherwise patient can call if having any difficulties otherwise.

## 2018-09-23 NOTE — Patient Instructions (Signed)
Good to see you  I am sorry not a lot better We will get you in with Dr. Ollen BowlHarkins to see if RFA is helpful  Stay active Happy holidays!  Write me with questions

## 2019-03-24 ENCOUNTER — Emergency Department (HOSPITAL_COMMUNITY): Payer: PRIVATE HEALTH INSURANCE

## 2019-03-24 ENCOUNTER — Encounter (HOSPITAL_COMMUNITY): Payer: Self-pay | Admitting: Internal Medicine

## 2019-03-24 ENCOUNTER — Inpatient Hospital Stay (HOSPITAL_COMMUNITY)
Admission: EM | Admit: 2019-03-24 | Discharge: 2019-03-28 | DRG: 056 | Disposition: A | Discharge status: Home / Self Care | Payer: PRIVATE HEALTH INSURANCE | Attending: Internal Medicine | Admitting: Internal Medicine

## 2019-03-24 DIAGNOSIS — F419 Anxiety disorder, unspecified: Secondary | ICD-10-CM | POA: Diagnosis present

## 2019-03-24 DIAGNOSIS — G92 Toxic encephalopathy: Secondary | ICD-10-CM | POA: Diagnosis present

## 2019-03-24 DIAGNOSIS — M549 Dorsalgia, unspecified: Secondary | ICD-10-CM | POA: Diagnosis present

## 2019-03-24 DIAGNOSIS — I10 Essential (primary) hypertension: Secondary | ICD-10-CM | POA: Diagnosis present

## 2019-03-24 DIAGNOSIS — T481X5A Adverse effect of skeletal muscle relaxants [neuromuscular blocking agents], initial encounter: Secondary | ICD-10-CM | POA: Diagnosis present

## 2019-03-24 DIAGNOSIS — F10231 Alcohol dependence with withdrawal delirium: Secondary | ICD-10-CM | POA: Diagnosis not present

## 2019-03-24 DIAGNOSIS — N179 Acute kidney failure, unspecified: Secondary | ICD-10-CM | POA: Diagnosis present

## 2019-03-24 DIAGNOSIS — G8929 Other chronic pain: Secondary | ICD-10-CM | POA: Diagnosis present

## 2019-03-24 DIAGNOSIS — T43015A Adverse effect of tricyclic antidepressants, initial encounter: Secondary | ICD-10-CM | POA: Diagnosis present

## 2019-03-24 DIAGNOSIS — Y92002 Bathroom of unspecified non-institutional (private) residence single-family (private) house as the place of occurrence of the external cause: Secondary | ICD-10-CM

## 2019-03-24 DIAGNOSIS — Z885 Allergy status to narcotic agent status: Secondary | ICD-10-CM | POA: Diagnosis not present

## 2019-03-24 DIAGNOSIS — M6282 Rhabdomyolysis: Secondary | ICD-10-CM | POA: Diagnosis present

## 2019-03-24 DIAGNOSIS — G9340 Encephalopathy, unspecified: Secondary | ICD-10-CM | POA: Diagnosis not present

## 2019-03-24 DIAGNOSIS — E875 Hyperkalemia: Secondary | ICD-10-CM | POA: Diagnosis present

## 2019-03-24 DIAGNOSIS — Z87442 Personal history of urinary calculi: Secondary | ICD-10-CM | POA: Diagnosis not present

## 2019-03-24 DIAGNOSIS — F19939 Other psychoactive substance use, unspecified with withdrawal, unspecified: Secondary | ICD-10-CM | POA: Diagnosis present

## 2019-03-24 DIAGNOSIS — Z79899 Other long term (current) drug therapy: Secondary | ICD-10-CM | POA: Diagnosis not present

## 2019-03-24 DIAGNOSIS — Z87892 Personal history of anaphylaxis: Secondary | ICD-10-CM | POA: Diagnosis not present

## 2019-03-24 DIAGNOSIS — T43215A Adverse effect of selective serotonin and norepinephrine reuptake inhibitors, initial encounter: Secondary | ICD-10-CM | POA: Diagnosis present

## 2019-03-24 DIAGNOSIS — Z8249 Family history of ischemic heart disease and other diseases of the circulatory system: Secondary | ICD-10-CM

## 2019-03-24 DIAGNOSIS — Z1159 Encounter for screening for other viral diseases: Secondary | ICD-10-CM

## 2019-03-24 DIAGNOSIS — F17290 Nicotine dependence, other tobacco product, uncomplicated: Secondary | ICD-10-CM | POA: Diagnosis present

## 2019-03-24 DIAGNOSIS — Z791 Long term (current) use of non-steroidal anti-inflammatories (NSAID): Secondary | ICD-10-CM

## 2019-03-24 DIAGNOSIS — F32A Depression, unspecified: Secondary | ICD-10-CM | POA: Diagnosis present

## 2019-03-24 DIAGNOSIS — E872 Acidosis: Secondary | ICD-10-CM | POA: Diagnosis present

## 2019-03-24 DIAGNOSIS — F329 Major depressive disorder, single episode, unspecified: Secondary | ICD-10-CM | POA: Diagnosis present

## 2019-03-24 DIAGNOSIS — Z91041 Radiographic dye allergy status: Secondary | ICD-10-CM

## 2019-03-24 DIAGNOSIS — G259 Extrapyramidal and movement disorder, unspecified: Secondary | ICD-10-CM | POA: Diagnosis present

## 2019-03-24 DIAGNOSIS — K219 Gastro-esophageal reflux disease without esophagitis: Secondary | ICD-10-CM | POA: Diagnosis present

## 2019-03-24 DIAGNOSIS — Z560 Unemployment, unspecified: Secondary | ICD-10-CM | POA: Diagnosis not present

## 2019-03-24 DIAGNOSIS — R4182 Altered mental status, unspecified: Secondary | ICD-10-CM

## 2019-03-24 DIAGNOSIS — R6510 Systemic inflammatory response syndrome (SIRS) of non-infectious origin without acute organ dysfunction: Secondary | ICD-10-CM | POA: Diagnosis not present

## 2019-03-24 DIAGNOSIS — R509 Fever, unspecified: Secondary | ICD-10-CM | POA: Diagnosis not present

## 2019-03-24 DIAGNOSIS — Z882 Allergy status to sulfonamides status: Secondary | ICD-10-CM

## 2019-03-24 DIAGNOSIS — Z808 Family history of malignant neoplasm of other organs or systems: Secondary | ICD-10-CM

## 2019-03-24 LAB — CBC WITH DIFFERENTIAL/PLATELET
Abs Immature Granulocytes: 0.08 10*3/uL — ABNORMAL HIGH (ref 0.00–0.07)
Basophils Absolute: 0 10*3/uL (ref 0.0–0.1)
Basophils Relative: 0 %
Eosinophils Absolute: 0 10*3/uL (ref 0.0–0.5)
Eosinophils Relative: 0 %
HCT: 38.9 % — ABNORMAL LOW (ref 39.0–52.0)
Hemoglobin: 12.7 g/dL — ABNORMAL LOW (ref 13.0–17.0)
Immature Granulocytes: 1 %
Lymphocytes Relative: 6 %
Lymphs Abs: 1.1 10*3/uL (ref 0.7–4.0)
MCH: 30.2 pg (ref 26.0–34.0)
MCHC: 32.6 g/dL (ref 30.0–36.0)
MCV: 92.4 fL (ref 80.0–100.0)
Monocytes Absolute: 2 10*3/uL — ABNORMAL HIGH (ref 0.1–1.0)
Monocytes Relative: 12 %
Neutro Abs: 14.4 10*3/uL — ABNORMAL HIGH (ref 1.7–7.7)
Neutrophils Relative %: 81 %
Platelets: 277 10*3/uL (ref 150–400)
RBC: 4.21 MIL/uL — ABNORMAL LOW (ref 4.22–5.81)
RDW: 13.6 % (ref 11.5–15.5)
WBC: 17.6 10*3/uL — ABNORMAL HIGH (ref 4.0–10.5)
nRBC: 0 % (ref 0.0–0.2)

## 2019-03-24 LAB — BASIC METABOLIC PANEL WITH GFR
Anion gap: 14 (ref 5–15)
BUN: 65 mg/dL — ABNORMAL HIGH (ref 6–20)
CO2: 18 mmol/L — ABNORMAL LOW (ref 22–32)
Calcium: 8.1 mg/dL — ABNORMAL LOW (ref 8.9–10.3)
Chloride: 110 mmol/L (ref 98–111)
Creatinine, Ser: 2.51 mg/dL — ABNORMAL HIGH (ref 0.61–1.24)
GFR calc Af Amer: 33 mL/min — ABNORMAL LOW
GFR calc non Af Amer: 29 mL/min — ABNORMAL LOW
Glucose, Bld: 126 mg/dL — ABNORMAL HIGH (ref 70–99)
Potassium: 5.8 mmol/L — ABNORMAL HIGH (ref 3.5–5.1)
Sodium: 142 mmol/L (ref 135–145)

## 2019-03-24 LAB — URINALYSIS, ROUTINE W REFLEX MICROSCOPIC
Bilirubin Urine: NEGATIVE
Glucose, UA: NEGATIVE mg/dL
Ketones, ur: 20 mg/dL — AB
Leukocytes,Ua: NEGATIVE
Nitrite: NEGATIVE
Protein, ur: NEGATIVE mg/dL
Specific Gravity, Urine: 1.016 (ref 1.005–1.030)
pH: 5 (ref 5.0–8.0)

## 2019-03-24 LAB — SALICYLATE LEVEL: Salicylate Lvl: <7 mg/dL (ref 2.8–30.0)

## 2019-03-24 LAB — COMPREHENSIVE METABOLIC PANEL
ALT: 63 U/L — ABNORMAL HIGH (ref 0–44)
AST: 120 U/L — ABNORMAL HIGH (ref 15–41)
Albumin: 4 g/dL (ref 3.5–5.0)
Alkaline Phosphatase: 54 U/L (ref 38–126)
Anion gap: 23 — ABNORMAL HIGH (ref 5–15)
BUN: 76 mg/dL — ABNORMAL HIGH (ref 6–20)
CO2: 16 mmol/L — ABNORMAL LOW (ref 22–32)
Calcium: 9.3 mg/dL (ref 8.9–10.3)
Chloride: 102 mmol/L (ref 98–111)
Creatinine, Ser: 4.47 mg/dL — ABNORMAL HIGH (ref 0.61–1.24)
GFR calc Af Amer: 16 mL/min — ABNORMAL LOW (ref >60)
GFR calc non Af Amer: 14 mL/min — ABNORMAL LOW (ref >60)
Glucose, Bld: 101 mg/dL — ABNORMAL HIGH (ref 70–99)
Potassium: 6 mmol/L — ABNORMAL HIGH (ref 3.5–5.1)
Sodium: 141 mmol/L (ref 135–145)
Total Bilirubin: 1.6 mg/dL — ABNORMAL HIGH (ref 0.3–1.2)
Total Protein: 7.4 g/dL (ref 6.5–8.1)

## 2019-03-24 LAB — RAPID URINE DRUG SCREEN, HOSP PERFORMED
Amphetamines: NOT DETECTED
Barbiturates: NOT DETECTED
Benzodiazepines: NOT DETECTED
Cocaine: NOT DETECTED
Opiates: POSITIVE — AB
Tetrahydrocannabinol: NOT DETECTED

## 2019-03-24 LAB — ACETAMINOPHEN LEVEL: Acetaminophen (Tylenol), Serum: <10 ug/mL — ABNORMAL LOW (ref 10–30)

## 2019-03-24 LAB — CBG MONITORING, ED: Glucose-Capillary: 102 mg/dL — ABNORMAL HIGH (ref 70–99)

## 2019-03-24 LAB — ETHANOL: Alcohol, Ethyl (B): <10 mg/dL (ref <10)

## 2019-03-24 LAB — LACTIC ACID, PLASMA: Lactic Acid, Venous: 1.9 mmol/L (ref 0.5–1.9)

## 2019-03-24 LAB — CK: Total CK: 5197 U/L — ABNORMAL HIGH (ref 49–397)

## 2019-03-24 LAB — SARS CORONAVIRUS 2 BY RT PCR (HOSPITAL ORDER, PERFORMED IN ~~LOC~~ HOSPITAL LAB): SARS Coronavirus 2: NEGATIVE

## 2019-03-24 MED ORDER — METOPROLOL TARTRATE 5 MG/5ML IV SOLN
5.0000 mg | Freq: Once | INTRAVENOUS | Status: AC
  Administered 2019-03-24: 5 mg via INTRAVENOUS
  Filled 2019-03-24: qty 5

## 2019-03-24 MED ORDER — SODIUM CHLORIDE 0.9 % IV BOLUS
1000.0000 mL | Freq: Once | INTRAVENOUS | Status: AC
  Administered 2019-03-24: 1000 mL via INTRAVENOUS

## 2019-03-24 MED ORDER — SODIUM CHLORIDE 0.9 % IV SOLN
2.0000 g | Freq: Once | INTRAVENOUS | Status: AC
  Administered 2019-03-24: 2 g via INTRAVENOUS
  Filled 2019-03-24: qty 2

## 2019-03-24 MED ORDER — DIPHENHYDRAMINE HCL 50 MG/ML IJ SOLN
25.0000 mg | Freq: Once | INTRAMUSCULAR | Status: AC
  Administered 2019-03-24: 25 mg via INTRAVENOUS
  Filled 2019-03-24: qty 1

## 2019-03-24 MED ORDER — SODIUM BICARBONATE 8.4 % IV SOLN
INTRAVENOUS | Status: DC
  Administered 2019-03-24 – 2019-03-26 (×6): via INTRAVENOUS
  Filled 2019-03-24 (×9): qty 150

## 2019-03-24 MED ORDER — SODIUM CHLORIDE 0.9 % IV BOLUS (SEPSIS)
1000.0000 mL | Freq: Once | INTRAVENOUS | Status: AC
  Administered 2019-03-24: 1000 mL via INTRAVENOUS

## 2019-03-24 MED ORDER — VANCOMYCIN HCL 10 G IV SOLR
1500.0000 mg | Freq: Once | INTRAVENOUS | Status: AC
  Administered 2019-03-24: 1500 mg via INTRAVENOUS
  Filled 2019-03-24: qty 1500

## 2019-03-24 MED ORDER — PIPERACILLIN-TAZOBACTAM 4.5 G IVPB
3.3750 g | Freq: Once | INTRAVENOUS | Status: AC
  Administered 2019-03-24: 3.375 g via INTRAVENOUS
  Filled 2019-03-24: qty 100

## 2019-03-24 MED ORDER — MIDAZOLAM HCL 2 MG/2ML IJ SOLN
2.0000 mg | INTRAMUSCULAR | Status: DC | PRN
  Administered 2019-03-24 – 2019-03-25 (×9): 2 mg via INTRAVENOUS
  Filled 2019-03-24 (×9): qty 2

## 2019-03-24 MED ORDER — ACETAMINOPHEN 650 MG RE SUPP
650.0000 mg | Freq: Four times a day (QID) | RECTAL | Status: DC | PRN
  Administered 2019-03-25 – 2019-03-26 (×3): 650 mg via RECTAL
  Filled 2019-03-24 (×3): qty 1

## 2019-03-24 MED ORDER — LORAZEPAM 2 MG/ML IJ SOLN
2.0000 mg | Freq: Once | INTRAMUSCULAR | Status: AC
  Administered 2019-03-24: 2 mg via INTRAVENOUS
  Filled 2019-03-24: qty 1

## 2019-03-24 MED ORDER — ENOXAPARIN SODIUM 40 MG/0.4ML ~~LOC~~ SOLN
40.0000 mg | SUBCUTANEOUS | Status: DC
  Administered 2019-03-25 – 2019-03-27 (×4): 40 mg via SUBCUTANEOUS
  Filled 2019-03-24 (×4): qty 0.4

## 2019-03-24 MED ORDER — LORAZEPAM 2 MG/ML IJ SOLN
INTRAMUSCULAR | Status: AC
  Administered 2019-03-24: 2 mg
  Filled 2019-03-24: qty 1

## 2019-03-24 MED ORDER — ACETAMINOPHEN 650 MG RE SUPP
650.0000 mg | Freq: Once | RECTAL | Status: AC
  Administered 2019-03-24: 650 mg via RECTAL
  Filled 2019-03-24: qty 1

## 2019-03-24 MED ORDER — MIDAZOLAM HCL 2 MG/2ML IJ SOLN
4.0000 mg | Freq: Once | INTRAMUSCULAR | Status: AC
  Administered 2019-03-24: 4 mg via INTRAVENOUS

## 2019-03-24 MED ORDER — SODIUM CHLORIDE 0.9 % IV SOLN
2.0000 g | Freq: Three times a day (TID) | INTRAVENOUS | Status: DC
  Administered 2019-03-25 – 2019-03-28 (×10): 2 g via INTRAVENOUS
  Filled 2019-03-24 (×10): qty 2

## 2019-03-24 MED ORDER — METRONIDAZOLE IN NACL 5-0.79 MG/ML-% IV SOLN
500.0000 mg | Freq: Three times a day (TID) | INTRAVENOUS | Status: DC
  Administered 2019-03-25: 500 mg via INTRAVENOUS
  Filled 2019-03-24: qty 100

## 2019-03-24 MED ORDER — ACETAMINOPHEN 325 MG PO TABS
650.0000 mg | ORAL_TABLET | Freq: Four times a day (QID) | ORAL | Status: DC | PRN
  Administered 2019-03-28: 650 mg via ORAL
  Filled 2019-03-24: qty 2

## 2019-03-24 MED ORDER — VANCOMYCIN HCL IN DEXTROSE 1-5 GM/200ML-% IV SOLN
1000.0000 mg | INTRAVENOUS | Status: DC
  Filled 2019-03-24 (×2): qty 200

## 2019-03-24 MED ORDER — MIDAZOLAM HCL 2 MG/2ML IJ SOLN
INTRAMUSCULAR | Status: AC
  Filled 2019-03-24: qty 4

## 2019-03-24 NOTE — ED Notes (Addendum)
ED TO INPATIENT HANDOFF REPORT  ED Nurse Name and Phone #:  [phone]  S Name/Age/Gender Ryan Duffy 53 y.o. male Room/Bed: 026C/026C  Code Status   Code Status: Not on file  Home/SNF/Other Unknown Patient oriented to: Disoriented  Is this baseline? No     Triage Complete: Triage complete  Chief Complaint altered  Triage Note Pt BIB [facility] from home. Per EMS, roommate called saying he had been in the restroom for approx. 24 hours. Upon EMS arrival, pt laying on restroom floor laying in feces and blood. Roommate stated that this has happened for but not seen for it.   Allergies Allergies  Allergen Reactions  . Ivp Dye [Iodinated Diagnostic Agents] Anaphylaxis  . Sulfamethoxazole Rash    Other reaction(s): Other (See Comments) [Rash] burning blisters  . Tramadol Nausea And Vomiting  . Sulfa Antibiotics Rash    Level of Care/Admitting Diagnosis ED Disposition    ED Disposition Condition Comment   Admit  Hospital Area: [facility] [[id number]]  Level of Care: Progressive [102]  Covid Evaluation: Confirmed COVID Negative  Diagnosis: Acute encephalopathy [[id number]]  Admitting Physician: Bobette Mo [[id number]]  Attending Physician: Bobette Mo [[id number]]  Estimated length of stay: past midnight tomorrow  Certification:: I certify this patient will need inpatient services for at least 2 midnights  PT Class (Do Not Modify): Inpatient [101]  PT Acc Code (Do Not Modify): Private [1]       B Medical/Surgery History Past Medical History:  Diagnosis Date  . Anxiety   . Back pain   . Depression   . GERD (gastroesophageal reflux disease)   . Hypertension   . Renal stones    Past Surgical History:  Procedure Laterality Date  . APPENDECTOMY    . KNEE ARTHROSCOPY W/ MENISCAL REPAIR Bilateral      A IV Location/Drains/Wounds Patient Lines/Drains/Airways Status   Active Line/Drains/Airways    Name:   Placement date:   Placement time:    Site:   Days:   Peripheral IV 03/24/19 Right Forearm   03/24/19    1147    Forearm   less than 1          Intake/Output Last 24 hours  Intake/Output Summary (Last 24 hours) at 03/24/2019 1801 Last data filed at 03/24/2019 1746 Gross per 24 hour  Intake 3000 ml  Output -  Net 3000 ml    Labs/Imaging Results for orders placed or performed during the hospital encounter of 03/24/19 (from the past 48 hour(s))  CBC with Differential     Status: Abnormal   Collection Time: 03/24/19 11:44 AM  Result Value Ref Range   WBC 17.6 (H) 4.0 - 10.5 K/uL   RBC 4.21 (L) 4.22 - 5.81 MIL/uL   Hemoglobin 12.7 (L) 13.0 - 17.0 g/dL   HCT 08.6 (L) 57.8 - 46.9 %   MCV 92.4 80.0 - 100.0 fL   MCH 30.2 26.0 - 34.0 pg   MCHC 32.6 30.0 - 36.0 g/dL   RDW 62.9 52.8 - 41.3 %   Platelets 277 150 - 400 K/uL   nRBC 0.0 0.0 - 0.2 %   Neutrophils Relative % 81 %   Neutro Abs 14.4 (H) 1.7 - 7.7 K/uL   Lymphocytes Relative 6 %   Lymphs Abs 1.1 0.7 - 4.0 K/uL   Monocytes Relative 12 %   Monocytes Absolute 2.0 (H) 0.1 - 1.0 K/uL   Eosinophils Relative 0 %   Eosinophils Absolute 0.0 0.0 - 0.5  K/uL   Basophils Relative 0 %   Basophils Absolute 0.0 0.0 - 0.1 K/uL   Immature Granulocytes 1 %   Abs Immature Granulocytes 0.08 (H) 0.00 - 0.07 K/uL    Comment: Performed at [facility], [address], [place], [place] [address]  Comprehensive metabolic panel     Status: Abnormal   Collection Time: 03/24/19 11:44 AM  Result Value Ref Range   Sodium 141 135 - 145 mmol/L   Potassium 6.0 (H) 3.5 - 5.1 mmol/L   Chloride 102 98 - 111 mmol/L   CO2 16 (L) 22 - 32 mmol/L   Glucose, Bld 101 (H) 70 - 99 mg/dL   BUN 76 (H) 6 - 20 mg/dL   Creatinine, Ser 2.59 (H) 0.61 - 1.24 mg/dL   Calcium 9.3 8.9 - 56.3 mg/dL   Total Protein 7.4 6.5 - 8.1 g/dL   Albumin 4.0 3.5 - 5.0 g/dL   AST 875 (H) 15 - 41 U/L   ALT 63 (H) 0 - 44 U/L   Alkaline Phosphatase 54 38 - 126 U/L   Total Bilirubin 1.6 (H) 0.3 - 1.2 mg/dL   GFR calc  non Af Amer 14 (L) >60 mL/min   GFR calc Af Amer 16 (L) >60 mL/min   Anion gap 23 (H) 5 - 15    Comment: Performed at [facility], [address] [place], [place] [address]  Urinalysis, Routine w reflex microscopic     Status: Abnormal   Collection Time: 03/24/19 11:44 AM  Result Value Ref Range   Color, Urine YELLOW YELLOW   APPearance CLOUDY (A) CLEAR   Specific Gravity, Urine 1.016 1.005 - 1.030   pH 5.0 5.0 - 8.0   Glucose, UA NEGATIVE NEGATIVE mg/dL   Hgb urine dipstick MODERATE (A) NEGATIVE   Bilirubin Urine NEGATIVE NEGATIVE   Ketones, ur 20 (A) NEGATIVE mg/dL   Protein, ur NEGATIVE NEGATIVE mg/dL   Nitrite NEGATIVE NEGATIVE   Leukocytes,Ua NEGATIVE NEGATIVE   RBC / HPF 0-5 0 - 5 RBC/hpf   WBC, UA 6-10 0 - 5 WBC/hpf   Bacteria, UA FEW (A) NONE SEEN   Squamous Epithelial / LPF 0-5 0 - 5   Mucus PRESENT    Hyaline Casts, UA PRESENT    Ca Oxalate Crys, UA PRESENT     Comment: Performed at [facility], [address], [place], [place] [address]  Urine rapid drug screen (hosp performed)     Status: Abnormal   Collection Time: 03/24/19 11:45 AM  Result Value Ref Range   Opiates POSITIVE (A) NONE DETECTED   Cocaine NONE DETECTED NONE DETECTED   Benzodiazepines NONE DETECTED NONE DETECTED   Amphetamines NONE DETECTED NONE DETECTED   Tetrahydrocannabinol NONE DETECTED NONE DETECTED   Barbiturates NONE DETECTED NONE DETECTED    Comment: (NOTE) DRUG SCREEN FOR MEDICAL PURPOSES ONLY.  IF CONFIRMATION IS NEEDED FOR ANY PURPOSE, NOTIFY LAB WITHIN 5 DAYS. LOWEST DETECTABLE LIMITS FOR URINE DRUG SCREEN Drug Class                     Cutoff (ng/mL) Amphetamine and metabolites    1000 Barbiturate and metabolites    200 Benzodiazepine                 200 Tricyclics and metabolites     300 Opiates and metabolites        300 Cocaine and metabolites  300 THC                            50 Performed at [facility], [address] [place],  [place] [address]   CBG monitoring, ED     Status: Abnormal   Collection Time: 03/24/19 12:09 PM  Result Value Ref Range   Glucose-Capillary 102 (H) 70 - 99 mg/dL  Lactic acid, plasma     Status: None   Collection Time: 03/24/19 12:18 PM  Result Value Ref Range   Lactic Acid, Venous 1.9 0.5 - 1.9 mmol/L    Comment: Performed at [facility], [address] [place], [place] [address]  SARS Coronavirus 2 (CEPHEID- Performed in [facility] hospital lab), Hosp Order     Status: None   Collection Time: 03/24/19 12:21 PM  Result Value Ref Range   SARS Coronavirus 2 NEGATIVE NEGATIVE    Comment: (NOTE) If result is NEGATIVE SARS-CoV-2 target nucleic acids are NOT DETECTED. The SARS-CoV-2 RNA is generally detectable in upper and lower  respiratory specimens during the acute phase of infection. The lowest  concentration of SARS-CoV-2 viral copies this assay can detect is 250  copies / mL. A negative result does not preclude SARS-CoV-2 infection  and should not be used as the sole basis for treatment or other  patient management decisions.  A negative result may occur with  improper specimen collection / handling, submission of specimen other  than nasopharyngeal swab, presence of viral mutation(s) within the  areas targeted by this assay, and inadequate number of viral copies  (<250 copies / mL). A negative result must be combined with clinical  observations, patient history, and epidemiological information. If result is POSITIVE SARS-CoV-2 target nucleic acids are DETECTED. The SARS-CoV-2 RNA is generally detectable in upper and lower  respiratory specimens dur ing the acute phase of infection.  Positive  results are indicative of active infection with SARS-CoV-2.  Clinical  correlation with patient history and other diagnostic information is  necessary to determine patient infection status.  Positive results do  not rule out bacterial infection or co-infection with other viruses. If  result is PRESUMPTIVE POSTIVE SARS-CoV-2 nucleic acids MAY BE PRESENT.   A presumptive positive result was obtained on the submitted specimen  and confirmed on repeat testing.  While 2019 novel coronavirus  (SARS-CoV-2) nucleic acids may be present in the submitted sample  additional confirmatory testing may be necessary for epidemiological  and / or clinical management purposes  to differentiate between  SARS-CoV-2 and other Sarbecovirus currently known to infect humans.  If clinically indicated additional testing with an alternate test  methodology [phone]) is advised. The SARS-CoV-2 RNA is generally  detectable in upper and lower respiratory sp ecimens during the acute  phase of infection. The expected result is Negative. Fact Sheet for Patients:  BoilerBrush.com.cy Fact Sheet for Healthcare Providers: https://pope.com/ This test is not yet approved or cleared by the [place] FDA and has been authorized for detection and/or diagnosis of SARS-CoV-2 by FDA under an Emergency Use Authorization (EUA).  This EUA will remain in effect (meaning this test can be used) for the duration of the COVID-19 declaration under Section 564(b)(1) of the Act, 21 U.S.C. section 360bbb-3(b)(1), unless the authorization is terminated or revoked sooner. Performed at [facility], [address], [place], [place] [address]   CK     Status: Abnormal   Collection Time:  03/24/19  1:38 PM  Result Value Ref Range   Total CK 5,197 (H) 49 - 397 U/L    Comment: RESULTS CONFIRMED BY MANUAL DILUTION Performed at [facility], [address] [place], [place] [address]   Ethanol     Status: None   Collection Time: 03/24/19  3:45 PM  Result Value Ref Range   Alcohol, Ethyl (B) <10 <10 mg/dL    Comment: (NOTE) Lowest detectable limit for serum alcohol is 10 mg/dL. For medical purposes only. Performed at [facility], [address]  [place], [place] [address]    Ct Head Wo Contrast  Result Date: 03/24/2019 CLINICAL DATA:  Encephalopathy. EXAM: CT HEAD WITHOUT CONTRAST TECHNIQUE: Contiguous axial images were obtained from the base of the skull through the vertex without intravenous contrast. COMPARISON:  04/10/2017 FINDINGS: Brain: Normal. Vascular: No hyperdense vessel or unexpected calcification. Skull: Normal. Sinuses/Orbits: Normal. Other: None IMPRESSION: Normal exam. Electronically Signed   By: Francene Boyers M.D.   On: 03/24/2019 15:08   Dg Chest Portable 1 View  Result Date: 03/24/2019 CLINICAL DATA:  Fevers, altered mental status EXAM: PORTABLE CHEST 1 VIEW COMPARISON:  04/10/2017 FINDINGS: The heart size and mediastinal contours are within normal limits. Both lungs are clear. The visualized skeletal structures are unremarkable. IMPRESSION: No active disease. Electronically Signed   By: Elige Ko   On: 03/24/2019 12:46    Pending Labs Unresulted Labs (From admission, onward)    Start     Ordered   03/24/19 1800  Basic metabolic panel  Once,   R     82/95/62 1726   03/24/19 1619  Acetaminophen level  ONCE - STAT,   STAT     03/24/19 1618   03/24/19 1619  Salicylate level  Add-on,   STAT     03/24/19 1618   03/24/19 1219  Blood culture (routine x 2)  BLOOD CULTURE X 2,   STAT    Question:  Patient immune status  Answer:  Normal   03/24/19 1219          Vitals/Pain Today's Vitals   03/24/19 1700 03/24/19 1715 03/24/19 1730 03/24/19 1745  BP: 114/62 105/64 116/60 129/75  Pulse: (!) 134 (!) 131 (!) 128 (!) 127  Resp:  19 20 (!) 21  Temp:      TempSrc:      SpO2: 96% 94% 94% 95%    Isolation Precautions Droplet and Contact precautions  Medications Medications  piperacillin-tazobactam (ZOSYN) IVPB 3.375 g (3.375 g Intravenous New Bag/Given 03/24/19 1744)  sodium bicarbonate 150 mEq in dextrose 5 % 1,000 mL infusion (has no administration in time range)  midazolam (VERSED) injection 2 mg (has no  administration in time range)  LORazepam (ATIVAN) 2 MG/ML injection (2 mg  Given 03/24/19 1148)  sodium chloride 0.9 % bolus 1,000 mL (0 mLs Intravenous Stopped 03/24/19 1310)  sodium chloride 0.9 % bolus 1,000 mL (0 mLs Intravenous Stopped 03/24/19 1429)  acetaminophen (TYLENOL) suppository 650 mg (650 mg Rectal Given 03/24/19 1354)  sodium chloride 0.9 % bolus 1,000 mL (0 mLs Intravenous Stopped 03/24/19 1746)  LORazepam (ATIVAN) injection 2 mg (2 mg Intravenous Given 03/24/19 1630)  midazolam (VERSED) injection 4 mg (4 mg Intravenous Given 03/24/19 1643)  diphenhydrAMINE (BENADRYL) injection 25 mg (25 mg Intravenous Given 03/24/19 1757)  sodium chloride 0.9 % bolus 1,000 mL (1,000 mLs Intravenous New Bag/Given 03/24/19 1743)    Mobility walks High fall risk   Focused Assessments Neuro  Assessment Handoff:  Swallow screen pass? No      Last date known well: 03/22/19   Neuro Assessment: Exceptions to WDL Neuro Checks:      Last Documented NIHSS Modified Score:   Has TPA been given? No If patient is a Neuro Trauma and patient is going to OR before floor call report to 4N Charge nurse: [phone] or [phone]     R Recommendations: See Admitting Provider Note  Report given to:   Additional Notes:  53 y/o male with anxiety, depression presenting with AMS, found by roommate on the bathroom floor in feces. Patient appears to be under the influence of a drug and responding to visual and internal stimuli. Patient also has a fever so AMS and septic workup was started. Fluids started and ativan given for agitation.  CK significant at 5,197. Admission for AMS and rhabdomyolysis. Unknown cause of AMS. Suspect illicit drug use. Does not specifically fit picture for serotonin syndrome. Remains tachycardic and confused.  Administer broad spectrum antibiotics

## 2019-03-24 NOTE — ED Triage Notes (Signed)
Pt BIB [facility] from home. Per EMS, roommate called saying he had been in the restroom for approx. 24 hours. Upon EMS arrival, pt laying on restroom floor laying in feces and blood. Roommate stated that this has happened for but not seen for it.

## 2019-03-24 NOTE — Progress Notes (Signed)
Pharmacy Antibiotic Note  Ryan Duffy is a 53 y.o. male admitted on 03/24/2019 with sepsis.  Last weight 87kg and height 70 inches tall. Serum creatinine 4.47>>2.51 (baseline ~1). Pharmacy has been consulted for vancomycin and cefepime dosing.  Plan: Vancomycin IV 1500mg  x1 Vancomycin IV 1000mg  q24h Cefepime 2g q8h Monitor renal function, LOT, culture data   Temp (24hrs), Avg:100.5 F (38.1 C), Min:99.6 F (37.6 C), Max:101.4 F (38.6 C)  Recent Labs  Lab 03/24/19 1144 03/24/19 1218 03/24/19 1800  WBC 17.6*  --   --   CREATININE 4.47*  --  2.51*  LATICACIDVEN  --  1.9  --     CrCl cannot be calculated (Unknown ideal weight.).    Allergies  Allergen Reactions  . Ivp Dye [Iodinated Diagnostic Agents] Anaphylaxis  . Sulfamethoxazole Rash    Other reaction(s): Other (See Comments) [Rash] burning blisters  . Tramadol Nausea And Vomiting  . Sulfa Antibiotics Rash    Antimicrobials this admission: Vanc 6/2 >> Cefepime 6/2 >>  Thank you for involving pharmacy in this patient's care.  Wendelyn Breslow, PharmD PGY1 Pharmacy Resident Phone: [phone] 03/24/2019 8:49 PM

## 2019-03-24 NOTE — H&P (Signed)
History and Physical    Ryan Duffy NUU:[id number] DOB: 19-Sep-1966 DOA: 03/24/2019  PCP: Geronimo Boot, MD   Patient coming from: Home.  I have personally briefly reviewed patient's old medical records in [facility]  Chief Complaint: AMS.  HPI: Ryan Duffy is a 53 y.o. male with medical history significant of anxiety, depression, back pain, GERD, hypertension, renal urolithiasis who is brought to the emergency department via EMS after his roommate called saying that the patient not being in the bathroom for approximately 24 hours.  When EMS arrived, the patient was laying on the bathroom floor with feces and blood on him.  He is unable to provide further information at this time.  I spoke to the patient's mother, who says in the past he had had trouble with substance abuse, but he has never had an instance like this.  She last spoke to him on Sunday.  She states that during the weekend he went out with some friends, but according to her, they do not have any further information.  The patient has been very restless in the emergency department.  He defecated on the bed.  He has been at times swinging his extremities and has nearly hit the staff a few times.  ED Course: Initial vital signs temperature 101.4 F, pulse 132, respiration 19, blood pressure 111/54 mmHg O2 sat 100% on room air.  The patient has been given 3000 mL of NS bolus, lorazepam 2 mg IVP x1 and an initial dose of Zosyn.  I added on another 1000 mL normal saline bolus.  I also had to give him another 2 mg of lorazepam due to continuous restlessness, which was followed by Versed 4 mg IVP.  Later after this, the patient was given Benadryl 25 mg IVP x1.  I started him on four-point soft restraints.  He has had a one-to-one sitter by bedside.  His urinalysis had a cloudy appearance with moderate hemoglobinuria, ketonuria 20 mg/dL and a few bacteria on microscopic examination.  UDS was positive for opiates, but the patient  takes oxycodone as needed at home.  CBC showed a white count of 17.6 with 81% neutrophils, hemoglobin 12.7 g/dL and platelets 347.  Lactic acid was normal.  Sodium 141, potassium 6.0, chloride 102 and CO2 16 mmol/L.  His anion gap was 23.  Glucose 101, BUN 76 and creatinine 4.47 mg/dL.  AST was 120 and ALT 63 units/L.  Total bilirubin was mildly elevated at 1.6 units/L.` He had a total CK of 5197 units/L.  Alcohol level was normal.  Acetaminophen and salicylate levels are still pending.  Imaging: His chest radiograph and CT head did not show any acute abnormalities.  Please see images and full radiology report for further detail.  Review of Systems: Unable to obtain.  Past Medical History:  Diagnosis Date  . Anxiety   . Back pain   . Depression   . GERD (gastroesophageal reflux disease)   . Hypertension   . Renal stones     Past Surgical History:  Procedure Laterality Date  . APPENDECTOMY    . KNEE ARTHROSCOPY W/ MENISCAL REPAIR Bilateral      reports that he has been smoking e-cigarettes. He has never used smokeless tobacco. He reports that he does not drink alcohol or use drugs.  Allergies  Allergen Reactions  . Ivp Dye [Iodinated Diagnostic Agents] Anaphylaxis  . Sulfamethoxazole Rash    Other reaction(s): Other (See Comments) [Rash] burning blisters  . Tramadol Nausea And Vomiting  .  Sulfa Antibiotics Rash    Family History  Problem Relation Age of Onset  . Hypertension Mother   . Hypercholesterolemia Mother   . Melanoma Mother   . Hypertension Father   . Renal Disease Father   . Heart attack Father   . CVA Father   . Transient ischemic attack Father   . Hypercholesterolemia Father   . HIV Brother    Prior to Admission medications   Medication Sig Start Date End Date Taking? Authorizing Provider  amitriptyline (ELAVIL) 50 MG tablet Take 50 mg by mouth at bedtime.   Yes [provider]  baclofen (LIORESAL) 10 MG tablet Take 10 mg by mouth 3 (three) times  daily as needed for muscle spasms.  11/28/17  Yes [provider]  clonazePAM (KLONOPIN) 0.5 MG tablet Take 0.5 mg by mouth 2 (two) times daily as needed for anxiety.   Yes [provider]  cyclobenzaprine (FLEXERIL) 5 MG tablet Take 5 mg by mouth 3 (three) times daily as needed for muscle spasms.   Yes [provider]  diclofenac (VOLTAREN) 75 MG EC tablet Take 75 mg by mouth 2 (two) times daily as needed for mild pain.  04/25/18  Yes [provider]  lisinopril (PRINIVIL,ZESTRIL) 10 MG tablet Take 10 mg by mouth daily.   Yes [provider]  venlafaxine XR (EFFEXOR-XR) 150 MG 24 hr capsule Take 150 mg by mouth daily with breakfast.   Yes [provider]  venlafaxine XR (EFFEXOR-XR) 75 MG 24 hr capsule Take 75 mg by mouth daily with breakfast. Take with 150mg  capsule   Yes [provider]  meloxicam (MOBIC) 15 MG tablet Take 1 tablet (15 mg total) by mouth daily. Patient not taking: Reported on 03/24/2019 04/11/17   Ward, Layla MawKristen N, DO  Oxycodone HCl 10 MG TABS Take 10 mg by mouth 4 (four) times daily as needed for severe pain. 03/21/19   [provider]    Physical Exam: Vitals:   03/24/19 1445 03/24/19 1515 03/24/19 1700 03/24/19 1715  BP: (!) 110/27 103/63 114/62 105/64  Pulse: (!) 117 (!) 132 (!) 134 (!) 131  Resp: 20 20  19   Temp:      TempSrc:      SpO2: 96% 97% 96% 94%    Constitutional: Restless. Eyes: PERRL, lids and conjunctivae are injected. ENMT: Mucous membranes are dry. Posterior pharynx clear of any exudate or lesions. Neck: normal, supple, no masses, no thyromegaly Respiratory: Decreased breath sounds on bases, otherwise clear to auscultation bilaterally, no wheezing, no crackles. Normal respiratory effort. No accessory muscle use.  Cardiovascular: Tachycardic in the 120s and 130s, no murmurs / rubs / gallops. No extremity edema. 2+ pedal pulses. No carotid bruits.  Abdomen: Soft, no tenderness, no masses  palpated. No hepatosplenomegaly. Bowel sounds positive.  Musculoskeletal: no clubbing / cyanosis. Good ROM, no contractures. Normal muscle tone.  Skin: no rashes, lesions, ulcers on limited dermatological examination. Neurologic: Moves all extremities.  Seems grossly nonfocal.  However he is unable to cooperate on his physical examination. Psychiatric: Restless, does not follow commands.  Labs on Admission: I have personally reviewed following labs and imaging studies  CBC: Recent Labs  Lab 03/24/19 1144  WBC 17.6*  NEUTROABS 14.4*  HGB 12.7*  HCT 38.9*  MCV 92.4  PLT 277   Basic Metabolic Panel: Recent Labs  Lab 03/24/19 1144  NA 141  K 6.0*  CL 102  CO2 16*  GLUCOSE 101*  BUN 76*  CREATININE 4.47*  CALCIUM 9.3   GFR: CrCl cannot be calculated (Unknown ideal weight.). Liver Function Tests: Recent Labs  Lab 03/24/19 1144  AST 120*  ALT 63*  ALKPHOS 54  BILITOT 1.6*  PROT 7.4  ALBUMIN 4.0   No results for input(s): LIPASE, AMYLASE in the last 168 hours. No results for input(s): AMMONIA in the last 168 hours. Coagulation Profile: No results for input(s): INR, PROTIME in the last 168 hours. Cardiac Enzymes: Recent Labs  Lab 03/24/19 1338  CKTOTAL 5,197*   BNP (last 3 results) No results for input(s): PROBNP in the last 8760 hours. HbA1C: No results for input(s): HGBA1C in the last 72 hours. CBG: Recent Labs  Lab 03/24/19 1209  GLUCAP 102*   Lipid Profile: No results for input(s): CHOL, HDL, LDLCALC, TRIG, CHOLHDL, LDLDIRECT in the last 72 hours. Thyroid Function Tests: No results for input(s): TSH, T4TOTAL, FREET4, T3FREE, THYROIDAB in the last 72 hours. Anemia Panel: No results for input(s): VITAMINB12, FOLATE, FERRITIN, TIBC, IRON, RETICCTPCT in the last 72 hours. Urine analysis:    Component Value Date/Time   COLORURINE YELLOW 03/24/2019 1144   APPEARANCEUR CLOUDY (A) 03/24/2019 1144   LABSPEC 1.016 03/24/2019 1144   PHURINE 5.0 03/24/2019  1144   GLUCOSEU NEGATIVE 03/24/2019 1144   HGBUR MODERATE (A) 03/24/2019 1144   BILIRUBINUR NEGATIVE 03/24/2019 1144   KETONESUR 20 (A) 03/24/2019 1144   PROTEINUR NEGATIVE 03/24/2019 1144   UROBILINOGEN 0.2 05/21/2016 1642   NITRITE NEGATIVE 03/24/2019 1144   LEUKOCYTESUR NEGATIVE 03/24/2019 1144    Radiological Exams on Admission: Ct Head Wo Contrast  Result Date: 03/24/2019 CLINICAL DATA:  Encephalopathy. EXAM: CT HEAD WITHOUT CONTRAST TECHNIQUE: Contiguous axial images were obtained from the base of the skull through the vertex without intravenous contrast. COMPARISON:  04/10/2017 FINDINGS: Brain: Normal. Vascular: No hyperdense vessel or unexpected calcification. Skull: Normal. Sinuses/Orbits: Normal. Other: None IMPRESSION: Normal exam. Electronically Signed   By: Francene Boyers M.D.   On: 03/24/2019 15:08   Dg Chest Portable 1 View  Result Date: 03/24/2019 CLINICAL DATA:  Fevers, altered mental status EXAM: PORTABLE CHEST 1 VIEW COMPARISON:  04/10/2017 FINDINGS: The heart size and mediastinal contours are within normal limits. Both lungs are clear. The visualized skeletal structures are unremarkable. IMPRESSION: No active disease. Electronically Signed   By: Elige Ko   On: 03/24/2019 12:46    EKG: Independently reviewed. Vent. rate 128 BPM PR interval * ms QRS duration 98 ms QT/QTc 315/460 ms P-R-T axes 65 -26 61 Sinus tachycardia Borderline left axis deviation RSR' in V1 or V2, probably normal variant ST elev, probable normal early repol pattern  Assessment/Plan Principal Problem:   Acute encephalopathy Admit to progressive unit/inpatient. Neuro checks every 2 hours, then every 4 hours if stable. Continue IV fluids. Keep n.p.o. for now. Continue treatment with antibiotics for SIRS. Continue AKI, hyperkalemia and rhabdomyolysis treatment. The patient's mother would like to be updated tomorrow.  Active Problems:   SIRS (systemic inflammatory response syndrome)  (HCC) No obvious source of infection. Will give sepsis unknown source antibiotic coverage. Continue IV fluids and follow-up blood culture and sensitivity.    Rhabdomyolysis Continue sodium bicarb infusion given azotemia induced acidosis and hyperkalemia. Monitor intake and output. Follow-up total CK level, renal function and electrolytes    AKI (acute kidney injury) (HCC) Continue IV fluids. Continue measures as above.    Hyperkalemia  As above. Follow-up potassium level.    Depression Hold antidepressants for now.    GERD (gastroesophageal reflux disease) Protonix  40 mg IVP every 24 hours.   DVT prophylaxis: Lovenox SQ. Code Status: Full code. Family Communication: Discussed the history and our findings with the patient's mother. Disposition Plan: Admit for IV hydration and IV antibiotics. Consults called: Admission status: Inpatient/progressive.   Bobette Mo MD [facility]  03/24/2019, 5:42 PM   This document was prepared using Dragon voice recognition software and may contain some unintended transcription errors.

## 2019-03-24 NOTE — ED Notes (Signed)
Nurse Navigator communication: an update on patients plan of care has been given to [place] the patients mother. She appreciated the update and added to the information that he had a hx of addiction 20 yrs ago with a gap of time until he had another 12 years ago. She is unsure who is prescribing his narcotics. She would like continued updates as she is away at this moment.

## 2019-03-24 NOTE — ED Notes (Signed)
Nurse Navigator communication: Mother of the pt called for an update, the admitting team is currently at bedside and has been sent a message to call her for more details updates.

## 2019-03-24 NOTE — ED Notes (Signed)
Kendal Hymen (Mother) [phone]

## 2019-03-24 NOTE — ED Provider Notes (Signed)
[facility] EMERGENCY DEPARTMENT Provider Note   CSN: [id number] Arrival date & time: 03/24/19  1116    History   Chief Complaint Chief Complaint  Patient presents with   Altered Mental Status    HPI Ricardo Schubach is a 53 y.o. male.     Patient is a 53 year old gentleman with past medical history of anxiety, depression, back pain, hypertension presenting to the emergency department for altered mental status.  Level 5 caveat due to patient's mental status at this time.  Per EMS report, the patient's roommate called EMS on the patient after the patient was found on the floor covered in feces in the bathroom.  Per EMS report, the roommate states that this has happened to the patient before but he did not come to the emergency department and was not seen for it and it resolved on its own.     Past Medical History:  Diagnosis Date   Anxiety    Back pain    Depression    GERD (gastroesophageal reflux disease)    Hypertension    Renal stones     Patient Active Problem List   Diagnosis Date Noted   Acute encephalopathy 03/24/2019   Rhabdomyolysis 03/24/2019   AKI (acute kidney injury) ([place]) 03/24/2019   Hyperkalemia 03/24/2019   Depression 03/24/2019   GERD (gastroesophageal reflux disease) 03/24/2019   SIRS (systemic inflammatory response syndrome) ([place]) 03/24/2019   Left lumbar radiculopathy 09/23/2018   SI (sacroiliac) joint dysfunction 06/02/2018   Nonallopathic lesion of sacral region 06/02/2018   Nonallopathic lesion of lumbosacral region 06/02/2018   Nonallopathic lesion of thoracic region 06/02/2018   Nonallopathic lesion of cervical region 06/02/2018    Past Surgical History:  Procedure Laterality Date   APPENDECTOMY     KNEE ARTHROSCOPY W/ MENISCAL REPAIR Bilateral         Home Medications    Prior to Admission medications   Medication Sig Start Date End Date Taking? Authorizing Provider  amitriptyline (ELAVIL)  50 MG tablet Take 50 mg by mouth at bedtime.   Yes [provider]  baclofen (LIORESAL) 10 MG tablet Take 10 mg by mouth 3 (three) times daily as needed for muscle spasms.  11/28/17  Yes [provider]  clonazePAM (KLONOPIN) 0.5 MG tablet Take 0.5 mg by mouth 2 (two) times daily as needed for anxiety.   Yes [provider]  cyclobenzaprine (FLEXERIL) 5 MG tablet Take 5 mg by mouth 3 (three) times daily as needed for muscle spasms.   Yes [provider]  diclofenac (VOLTAREN) 75 MG EC tablet Take 75 mg by mouth 2 (two) times daily as needed for mild pain.  04/25/18  Yes [provider]  lisinopril (PRINIVIL,ZESTRIL) 10 MG tablet Take 10 mg by mouth daily.   Yes [provider]  venlafaxine XR (EFFEXOR-XR) 150 MG 24 hr capsule Take 150 mg by mouth daily with breakfast.   Yes [provider]  venlafaxine XR (EFFEXOR-XR) 75 MG 24 hr capsule Take 75 mg by mouth daily with breakfast. Take with 16m capsule   Yes [provider]  meloxicam (MOBIC) 15 MG tablet Take 1 tablet (15 mg total) by mouth daily. Patient not taking: Reported on 03/24/2019 04/11/17   Ward, KDelice Bison DO  Oxycodone HCl 10 MG TABS Take 10 mg by mouth 4 (four) times daily as needed for severe pain. 03/21/19   [provider]    Family History Family History  Problem Relation Age of  Onset   Hypertension Mother    Hypercholesterolemia Mother    Melanoma Mother    Hypertension Father    Renal Disease Father    Heart attack Father    CVA Father    Transient ischemic attack Father    Hypercholesterolemia Father    HIV Brother     Social History Social History   Tobacco Use   Smoking status: Current Every Day Smoker    Types: E-cigarettes   Smokeless tobacco: Never Used  Substance Use Topics   Alcohol use: No    Comment: vaporizes with nicotine   Drug use: No     Allergies   Ivp dye [iodinated diagnostic agents];  Sulfamethoxazole; Tramadol; and Sulfa antibiotics   Review of Systems Review of Systems  Unable to perform ROS: Mental status change     Physical Exam Updated Vital Signs BP (!) 147/90    Pulse (!) 119    Temp 99.8 F (37.7 C) (Rectal)    Resp (!) 31    SpO2 92%   Physical Exam Constitutional:      Comments: Patient is uncooperative, smiling inappropriately, yelling out inappropriately and flailing his extremities.  He is alert but very disoriented and does not answer any of my questions.  He will occasionally follow some commands.  He is covered in urine and feces.  HENT:     Head: Normocephalic and atraumatic.     Nose: Nose normal.     Mouth/Throat:     Mouth: Mucous membranes are dry.  Eyes:     Pupils: Pupils are equal, round, and reactive to light.  Cardiovascular:     Rate and Rhythm: Regular rhythm. Tachycardia present.  Pulmonary:     Effort: Pulmonary effort is normal.     Breath sounds: Normal breath sounds.  Abdominal:     General: Abdomen is flat. There is no distension.     Tenderness: There is no abdominal tenderness.  Musculoskeletal:     Comments: Moving all 4 extremities without pain.  No signs of any deformity or injury to the skin  Skin:    General: Skin is warm.     Comments: No bruising, lacerations or signs of injury.  Neurological:     Mental Status: He is alert.     Comments: Not speaking coherent words. No clonus or cog wheeling. Yelling out. No tremors, fasciculations  Psychiatric:     Comments: Bizarre and abnormal behavior      ED Treatments / Results  Labs (all labs ordered are listed, but only abnormal results are displayed) Labs Reviewed  CBC WITH DIFFERENTIAL/PLATELET - Abnormal; Notable for the following components:      Result Value   WBC 17.6 (*)    RBC 4.21 (*)    Hemoglobin 12.7 (*)    HCT 38.9 (*)    Neutro Abs 14.4 (*)    Monocytes Absolute 2.0 (*)    Abs Immature Granulocytes 0.08 (*)    All other components within  normal limits  COMPREHENSIVE METABOLIC PANEL - Abnormal; Notable for the following components:   Potassium 6.0 (*)    CO2 16 (*)    Glucose, Bld 101 (*)    BUN 76 (*)    Creatinine, Ser 4.47 (*)    AST 120 (*)    ALT 63 (*)    Total Bilirubin 1.6 (*)    GFR calc non Af Amer 14 (*)    GFR calc Af Amer 16 (*)  Anion gap 23 (*)    All other components within normal limits  URINALYSIS, ROUTINE W REFLEX MICROSCOPIC - Abnormal; Notable for the following components:   APPearance CLOUDY (*)    Hgb urine dipstick MODERATE (*)    Ketones, ur 20 (*)    Bacteria, UA FEW (*)    All other components within normal limits  RAPID URINE DRUG SCREEN, HOSP PERFORMED - Abnormal; Notable for the following components:   Opiates POSITIVE (*)    All other components within normal limits  CK - Abnormal; Notable for the following components:   Total CK 5,197 (*)    All other components within normal limits  ACETAMINOPHEN LEVEL - Abnormal; Notable for the following components:   Acetaminophen (Tylenol), Serum <10 (*)    All other components within normal limits  BASIC METABOLIC PANEL - Abnormal; Notable for the following components:   Potassium 5.8 (*)    CO2 18 (*)    Glucose, Bld 126 (*)    BUN 65 (*)    Creatinine, Ser 2.51 (*)    Calcium 8.1 (*)    GFR calc non Af Amer 29 (*)    GFR calc Af Amer 33 (*)    All other components within normal limits  CBC WITH DIFFERENTIAL/PLATELET - Abnormal; Notable for the following components:   RBC 5.99 (*)    Hemoglobin 18.0 (*)    HCT 53.0 (*)    Platelets 108 (*)    Lymphs Abs 0.6 (*)    All other components within normal limits  CK - Abnormal; Notable for the following components:   Total CK 3,850 (*)    All other components within normal limits  COMPREHENSIVE METABOLIC PANEL - Abnormal; Notable for the following components:   Glucose, Bld 170 (*)    BUN 27 (*)    Calcium 8.3 (*)    Total Protein 5.7 (*)    Albumin 2.8 (*)    AST 101 (*)    ALT  52 (*)    All other components within normal limits  CK - Abnormal; Notable for the following components:   Total CK 1,857 (*)    All other components within normal limits  CBC WITH DIFFERENTIAL/PLATELET - Abnormal; Notable for the following components:   WBC 11.2 (*)    RBC 3.93 (*)    Hemoglobin 12.0 (*)    HCT 35.3 (*)    Neutro Abs 8.3 (*)    Monocytes Absolute 1.3 (*)    All other components within normal limits  COMPREHENSIVE METABOLIC PANEL - Abnormal; Notable for the following components:   Potassium 3.3 (*)    Glucose, Bld 141 (*)    Calcium 8.4 (*)    Total Protein 5.6 (*)    Albumin 2.6 (*)    AST 75 (*)    ALT 54 (*)    All other components within normal limits  CBG MONITORING, ED - Abnormal; Notable for the following components:   Glucose-Capillary 102 (*)    All other components within normal limits  CULTURE, BLOOD (ROUTINE X 2)  CULTURE, BLOOD (ROUTINE X 2)  SARS CORONAVIRUS 2 (HOSPITAL ORDER, [facility])  ETHANOL  LACTIC ACID, PLASMA  SALICYLATE LEVEL  HIV ANTIBODY (ROUTINE TESTING W REFLEX)  CBC WITH DIFFERENTIAL/PLATELET    EKG EKG Interpretation  Date/Time:  Tuesday March 24 2019 11:33:46 EDT Ventricular Rate:  128 PR Interval:    QRS Duration: 98 QT Interval:  315 QTC Calculation:  460 R Axis:   -26 Text Interpretation:  Sinus tachycardia Borderline left axis deviation RSR' in V1 or V2, probably normal variant ST elev, probable normal early repol pattern Confirmed by Davonna Belling [phone]) on 03/24/2019 1:40:58 PM   Radiology No results found.  Procedures Procedures (including critical care time)  Medications Ordered in ED Medications  sodium bicarbonate 150 mEq in dextrose 5 % 1,000 mL infusion ( Intravenous New Bag/Given 03/26/19 1200)  enoxaparin (LOVENOX) injection 40 mg (40 mg Subcutaneous Given 03/25/19 2333)  acetaminophen (TYLENOL) tablet 650 mg ( Oral See Alternative 03/26/19 0927)    Or  acetaminophen  (TYLENOL) suppository 650 mg (650 mg Rectal Given 03/26/19 0927)  ceFEPIme (MAXIPIME) 2 g in sodium chloride 0.9 % 100 mL IVPB (2 g Intravenous New Bag/Given 03/26/19 0740)  haloperidol lactate (HALDOL) injection 2 mg (2 mg Intravenous Given 03/26/19 0242)  LORazepam (ATIVAN) tablet 1 mg (has no administration in time range)    Or  LORazepam (ATIVAN) injection 1 mg (has no administration in time range)  thiamine (VITAMIN B-1) tablet 100 mg ( Oral See Alternative 03/26/19 0921)    Or  thiamine (B-1) injection 100 mg (100 mg Intravenous Given 01/26/55 8127)  folic acid (FOLVITE) tablet 1 mg (1 mg Oral Not Given 03/26/19 1053)  multivitamin with minerals tablet 1 tablet (1 tablet Oral Not Given 03/26/19 1053)  LORazepam (ATIVAN) injection 0-4 mg (2 mg Intravenous Given 03/26/19 0739)    Followed by  LORazepam (ATIVAN) injection 0-4 mg (has no administration in time range)  vancomycin (VANCOCIN) 1,250 mg in sodium chloride 0.9 % 250 mL IVPB (1,250 mg Intravenous New Bag/Given 03/26/19 0926)  sodium chloride flush (NS) 0.9 % injection 10-40 mL (has no administration in time range)  potassium chloride SA (K-DUR) CR tablet 40 mEq (has no administration in time range)  LORazepam (ATIVAN) 2 MG/ML injection (2 mg  Given 03/24/19 1148)  sodium chloride 0.9 % bolus 1,000 mL (0 mLs Intravenous Stopped 03/24/19 1310)  sodium chloride 0.9 % bolus 1,000 mL (0 mLs Intravenous Stopped 03/24/19 1429)  acetaminophen (TYLENOL) suppository 650 mg (650 mg Rectal Given 03/24/19 1354)  sodium chloride 0.9 % bolus 1,000 mL (0 mLs Intravenous Stopped 03/24/19 1746)  piperacillin-tazobactam (ZOSYN) IVPB 3.375 g (0 g Intravenous Stopped 03/24/19 1812)  LORazepam (ATIVAN) injection 2 mg (2 mg Intravenous Given 03/24/19 1630)  midazolam (VERSED) injection 4 mg (4 mg Intravenous Given 03/24/19 1643)  diphenhydrAMINE (BENADRYL) injection 25 mg (25 mg Intravenous Given 03/24/19 1757)  sodium chloride 0.9 % bolus 1,000 mL (0 mLs Intravenous Stopped 03/24/19  1812)  ceFEPIme (MAXIPIME) 2 g in sodium chloride 0.9 % 100 mL IVPB (2 g Intravenous New Bag/Given 03/24/19 1950)  vancomycin (VANCOCIN) 1,500 mg in sodium chloride 0.9 % 500 mL IVPB (0 mg Intravenous Stopped 03/24/19 2237)  metoprolol tartrate (LOPRESSOR) injection 5 mg (5 mg Intravenous Given 03/24/19 2126)  haloperidol lactate (HALDOL) injection 5 mg (5 mg Intramuscular Given 03/25/19 0916)  haloperidol lactate (HALDOL) 5 MG/ML injection (  Duplicate 02/19/69 0174)  LORazepam (ATIVAN) injection 1 mg (1 mg Intravenous Given 03/25/19 2333)     Initial Impression / Assessment and Plan / ED Course  I have reviewed the triage vital signs and the nursing notes.  Pertinent labs & imaging results that were available during my care of the patient were reviewed by me and considered in my medical decision making (see chart for details).  Clinical Course as of Mar 25 1512  Tue Mar 24, 2019  1228 53 y/o male with anxiety, depression presenting with AMS, found by roommate on the bathroom floor in feces. Patient appears to be under the influence of a drug and responding to visual and internal stimuli. Patient also has a fever so AMS and septic workup was started. Fluids started and ativan given for agitation. I spoke with the patient's mother who stated the patient does have a remote history of drug abuse in the past. No known IV drug use.    [KM]  1337 Nitrite: NEGATIVE [KM]  2355 Patient lab work reveals a white blood cell count of 17.6, opiates positive in urine drug screen, significant renal impairment with a creatinine of 4.47 and a potassium of 6.0.  No EKG changes.  Also has elevation in AST and ALT with a normal alk phos.  He is resting on stretcher but easily arousable and still completely altered.  Lactic acid of 1.9.  Waiting on CK and had CT scan.  Chest x-ray is normal.  No source of infection at this time.  Continuing to push fluids. Holding on additional treatment of hyperkalemia at this time since there  are no EKG changes.   [KM]  7322 CK significant at 5,197. Will consult hospitalist for admission for AMS and rhabdomyolysis. Unclear etiology of AMS. Suspect illicit drug use. Does not specifically fit picture for serotonin syndrome. Remains tachycardic and confused. Will initiate broad spectrum antibiotics   [KM]  1615 Patient care passed to Suella Broad PA-C due to change of shift. Patient to be admitted.    [KM]    Clinical Course User Index [KM] Alveria Apley, PA-C       CRITICAL CARE Performed by: Alveria Apley   Total critical care time: 35 minutes  Critical care time was exclusive of separately billable procedures and treating other patients.  Critical care was necessary to treat or prevent imminent or life-threatening deterioration.  Critical care was time spent personally by me on the following activities: development of treatment plan with patient and/or surrogate as well as nursing, discussions with consultants, evaluation of patient's response to treatment, examination of patient, obtaining history from patient or surrogate, ordering and performing treatments and interventions, ordering and review of laboratory studies, ordering and review of radiographic studies, pulse oximetry and re-evaluation of patient's condition.   Final Clinical Impressions(s) / ED Diagnoses   Final diagnoses:  Altered mental status, unspecified altered mental status type  AKI (acute kidney injury) ([place])  Hyperkalemia  Fever, unspecified fever cause    ED Discharge Orders    None       Kristine Royal 03/26/19 1513    Davonna Belling, MD 03/26/19 1517

## 2019-03-24 NOTE — ED Provider Notes (Signed)
53yo male with history of anxiety and depression, brought in for AMS. Febrile on arrival, no obvious source for infection, no obvious source for AMS. No history or evidence of IVDA. Found to have rhabdomyolysis, given NS IV. Given Ativan for agitation. Suspect drug use. K 6.0 without EKG changes, not tolerating POs so will not tolerate Lokelma.  Plan is to admit. Physical Exam  BP 103/63   Pulse (!) 132   Temp (S) (!) 101.4 F (38.6 C) (Rectal)   Resp 20   SpO2 97%   Physical Exam Patient is moaning and rocking on the bed, attempted to roll over the bed rails. Additional Ativan ordered.  ED Course/Procedures   Clinical Course as of Mar 24 1623  Tue Mar 24, 2019  1228 53 y/o male with anxiety, depression presenting with AMS, found by roommate on the bathroom floor in feces. Patient appears to be under the influence of a drug and responding to visual and internal stimuli. Patient also has a fever so AMS and septic workup was started. Fluids started and ativan given for agitation. I spoke with the patient's mother who stated the patient does have a remote history of drug abuse in the past. No known IV drug use.    [KM]  1337 Nitrite: NEGATIVE [KM]  9629 Patient lab work reveals a white blood cell count of 17.6, opiates positive in urine drug screen, significant renal impairment with a creatinine of 4.47 and a potassium of 6.0.  No EKG changes.  Also has elevation in AST and ALT with a normal alk phos.  He is resting on stretcher but easily arousable and still completely altered.  Lactic acid of 1.9.  Waiting on CK and had CT scan.  Chest x-ray is normal.  No source of infection at this time.  Continuing to push fluids. Holding on additional treatment of hyperkalemia at this time since there are no EKG changes.   [KM]  5284 CK significant at 5,197. Will consult hospitalist for admission for AMS and rhabdomyolysis. Unclear etiology of AMS. Suspect illicit drug use. Does not specifically fit picture  for serotonin syndrome. Remains tachycardic and confused. Will initiate broad spectrum antibiotics   [KM]  1615 Patient care passed to Suella Broad PA-C due to change of shift. Patient to be admitted.    [KM]    Clinical Course User Index [KM] Alveria Apley, PA-C    Procedures  MDM  Case discussed with hospitalist who will consult for admission.       Tacy Learn, PA-C 03/24/19 1625    Valarie Merino, MD 03/25/19 [id number]

## 2019-03-24 NOTE — ED Notes (Signed)
Pt's mother Kendal Hymen called for update, this RN along with the EDP spoke to mother. Mother is currently out of town.  We will update her when testing has resulted.

## 2019-03-25 DIAGNOSIS — F10231 Alcohol dependence with withdrawal delirium: Secondary | ICD-10-CM

## 2019-03-25 DIAGNOSIS — R4182 Altered mental status, unspecified: Secondary | ICD-10-CM

## 2019-03-25 DIAGNOSIS — N179 Acute kidney failure, unspecified: Secondary | ICD-10-CM

## 2019-03-25 DIAGNOSIS — R6510 Systemic inflammatory response syndrome (SIRS) of non-infectious origin without acute organ dysfunction: Secondary | ICD-10-CM

## 2019-03-25 DIAGNOSIS — R509 Fever, unspecified: Secondary | ICD-10-CM

## 2019-03-25 DIAGNOSIS — G9340 Encephalopathy, unspecified: Secondary | ICD-10-CM

## 2019-03-25 LAB — CBC WITH DIFFERENTIAL/PLATELET
Abs Immature Granulocytes: 0.03 10*3/uL (ref 0.00–0.07)
Basophils Absolute: 0 10*3/uL (ref 0.0–0.1)
Basophils Relative: 0 %
Eosinophils Absolute: 0.1 10*3/uL (ref 0.0–0.5)
Eosinophils Relative: 2 %
HCT: 53 % — ABNORMAL HIGH (ref 39.0–52.0)
Hemoglobin: 18 g/dL — ABNORMAL HIGH (ref 13.0–17.0)
Immature Granulocytes: 1 %
Lymphocytes Relative: 10 %
Lymphs Abs: 0.6 10*3/uL — ABNORMAL LOW (ref 0.7–4.0)
MCH: 30.1 pg (ref 26.0–34.0)
MCHC: 34 g/dL (ref 30.0–36.0)
MCV: 88.5 fL (ref 80.0–100.0)
Monocytes Absolute: 0.5 10*3/uL (ref 0.1–1.0)
Monocytes Relative: 8 %
Neutro Abs: 4.6 10*3/uL (ref 1.7–7.7)
Neutrophils Relative %: 79 %
Platelets: 108 10*3/uL — ABNORMAL LOW (ref 150–400)
RBC: 5.99 MIL/uL — ABNORMAL HIGH (ref 4.22–5.81)
RDW: 14 % (ref 11.5–15.5)
WBC: 5.9 10*3/uL (ref 4.0–10.5)
nRBC: 0 % (ref 0.0–0.2)

## 2019-03-25 LAB — COMPREHENSIVE METABOLIC PANEL
ALT: 52 U/L — ABNORMAL HIGH (ref 0–44)
AST: 101 U/L — ABNORMAL HIGH (ref 15–41)
Albumin: 2.8 g/dL — ABNORMAL LOW (ref 3.5–5.0)
Alkaline Phosphatase: 44 U/L (ref 38–126)
Anion gap: 9 (ref 5–15)
BUN: 27 mg/dL — ABNORMAL HIGH (ref 6–20)
CO2: 28 mmol/L (ref 22–32)
Calcium: 8.3 mg/dL — ABNORMAL LOW (ref 8.9–10.3)
Chloride: 108 mmol/L (ref 98–111)
Creatinine, Ser: 0.92 mg/dL (ref 0.61–1.24)
GFR calc Af Amer: >60 mL/min (ref >60)
GFR calc non Af Amer: >60 mL/min (ref >60)
Glucose, Bld: 170 mg/dL — ABNORMAL HIGH (ref 70–99)
Potassium: 4.5 mmol/L (ref 3.5–5.1)
Sodium: 145 mmol/L (ref 135–145)
Total Bilirubin: 1.1 mg/dL (ref 0.3–1.2)
Total Protein: 5.7 g/dL — ABNORMAL LOW (ref 6.5–8.1)

## 2019-03-25 LAB — CK: Total CK: 3850 U/L — ABNORMAL HIGH (ref 49–397)

## 2019-03-25 MED ORDER — ADULT MULTIVITAMIN W/MINERALS CH
1.0000 | ORAL_TABLET | Freq: Every day | ORAL | Status: DC
  Administered 2019-03-27 – 2019-03-28 (×2): 1 via ORAL
  Filled 2019-03-25 (×3): qty 1

## 2019-03-25 MED ORDER — LORAZEPAM 2 MG/ML IJ SOLN
1.0000 mg | Freq: Once | INTRAMUSCULAR | Status: AC
  Administered 2019-03-25: 1 mg via INTRAVENOUS
  Filled 2019-03-25: qty 1

## 2019-03-25 MED ORDER — FOLIC ACID 1 MG PO TABS
1.0000 mg | ORAL_TABLET | Freq: Every day | ORAL | Status: DC
  Administered 2019-03-27 – 2019-03-28 (×2): 1 mg via ORAL
  Filled 2019-03-25 (×3): qty 1

## 2019-03-25 MED ORDER — LORAZEPAM 2 MG/ML IJ SOLN
0.0000 mg | Freq: Four times a day (QID) | INTRAMUSCULAR | Status: AC
  Administered 2019-03-25: 4 mg via INTRAVENOUS
  Administered 2019-03-25 – 2019-03-26 (×2): 2 mg via INTRAVENOUS
  Administered 2019-03-26: 3 mg via INTRAVENOUS
  Administered 2019-03-26 – 2019-03-27 (×2): 2 mg via INTRAVENOUS
  Filled 2019-03-25 (×3): qty 1
  Filled 2019-03-25: qty 2
  Filled 2019-03-25: qty 1
  Filled 2019-03-25: qty 2

## 2019-03-25 MED ORDER — LORAZEPAM 2 MG/ML IJ SOLN
2.0000 mg | INTRAMUSCULAR | Status: DC | PRN
  Administered 2019-03-25 (×4): 2 mg via INTRAVENOUS
  Filled 2019-03-25 (×4): qty 1

## 2019-03-25 MED ORDER — LORAZEPAM 2 MG/ML IJ SOLN
0.0000 mg | Freq: Two times a day (BID) | INTRAMUSCULAR | Status: DC
  Administered 2019-03-27: 2 mg via INTRAVENOUS
  Filled 2019-03-25: qty 1

## 2019-03-25 MED ORDER — HALOPERIDOL LACTATE 5 MG/ML IJ SOLN
2.0000 mg | Freq: Four times a day (QID) | INTRAMUSCULAR | Status: DC | PRN
  Administered 2019-03-25 – 2019-03-27 (×4): 2 mg via INTRAVENOUS
  Filled 2019-03-25 (×4): qty 1

## 2019-03-25 MED ORDER — HALOPERIDOL LACTATE 5 MG/ML IJ SOLN
INTRAMUSCULAR | Status: AC
  Filled 2019-03-25: qty 1

## 2019-03-25 MED ORDER — LORAZEPAM 2 MG/ML IJ SOLN: 1.0000 mg | Freq: Four times a day (QID) | INTRAMUSCULAR | Status: AC | PRN

## 2019-03-25 MED ORDER — VITAMIN B-1 100 MG PO TABS
100.0000 mg | ORAL_TABLET | Freq: Every day | ORAL | Status: DC
  Administered 2019-03-27 – 2019-03-28 (×2): 100 mg via ORAL
  Filled 2019-03-25 (×2): qty 1

## 2019-03-25 MED ORDER — LORAZEPAM 1 MG PO TABS: 1.0000 mg | ORAL_TABLET | Freq: Four times a day (QID) | ORAL | Status: AC | PRN

## 2019-03-25 MED ORDER — THIAMINE HCL 100 MG/ML IJ SOLN
100.0000 mg | Freq: Every day | INTRAMUSCULAR | Status: DC
  Administered 2019-03-25 – 2019-03-26 (×2): 100 mg via INTRAVENOUS
  Filled 2019-03-25 (×2): qty 2

## 2019-03-25 MED ORDER — VANCOMYCIN HCL 10 G IV SOLR
1250.0000 mg | Freq: Two times a day (BID) | INTRAVENOUS | Status: DC
  Administered 2019-03-25 – 2019-03-27 (×4): 1250 mg via INTRAVENOUS
  Filled 2019-03-25 (×5): qty 1250

## 2019-03-25 MED ORDER — HALOPERIDOL LACTATE 5 MG/ML IJ SOLN
5.0000 mg | Freq: Once | INTRAMUSCULAR | Status: AC
  Administered 2019-03-25: 5 mg via INTRAMUSCULAR

## 2019-03-25 NOTE — Progress Notes (Signed)
Unable to obtain a second PIV for the patient at this time. The patient is awake and confused and not following commands. MD Rhona Leavens made aware of the patients behavior via verbal conversation. MD also was present during PIV attempt. At this time the patient has 1 PIV access and only needed a second one for just in case. If another PIV is needed the RN will replace IV team consult.

## 2019-03-25 NOTE — Progress Notes (Signed)
Pt's restraints to his wrists and waist removed during personal care. RN will continue to monitor pt and assess if restraints needed again.

## 2019-03-25 NOTE — Significant Event (Addendum)
Rapid Response Event Note   Throughout the day, I have seen Mr. Tippets, each time, he was resting comfortably and breathing well. At 1730, 5W nurses updated me, patient has received Ativan 14 mg IV total today. I went again at 1730, he was resting, I spoke with the TRH MD, Ativan PRN Q15 min was discontinued. I explicitly explained to the nursing staff that if the patient needs "breakthrough" medications, nurses need to call the provider on call and the RR RN. I also updated the oncoming TR[facility] NP as well of the situation.   Please call RRT if needed  Shayleen Eppinger R

## 2019-03-25 NOTE — Progress Notes (Signed)
Patient admitted for Acute encephalopathy.  Patient arrived into the floor at exactly 2053.  He is alert, confused, agitated, restless and combative and uncooperative  Unable to follow command. Patient came in with Bilateral wrist restraint. skin assessed no injury noted.   Bed alarm set. Has an order for 1: 1 safety sitter, unable to obtained safety sitter from staffing at this time.  Option for Telesitter.  Telesitter is not available at this time patient on waiting list for either sitter or Telesitter.  Prn medication has been in use to control agitation. Abrasions and redness noted on the extremities.  Will continue to monitor

## 2019-03-25 NOTE — Patient Care Conference (Signed)
Called and updated patient's mother over phone. All questions answered.

## 2019-03-25 NOTE — Progress Notes (Signed)
2310- Pt in bilateral wrist restraints, kicking legs and screaming. I spoke to Ester, Charity fundraiser and asked if she could give him something to relax him and then put in another IV consult please.

## 2019-03-25 NOTE — Progress Notes (Signed)
Over night shift 03/24/19 pt required 14 mg of versed. During the day shift, pt continued to present with increased agitation, anxiety, and restlessness. He has been responsive to voice but personally non-verbal & unable to follow commands. Pt had a fever intermittently throughout shift that Rhona Leavens MD was notified of. Throughout today's shift RN communicated with Rhona Leavens MD about pt's unrelieved agitation and confusion. Chiu MD's recommendation with unrelieved agitation was to administer ativan. RN reassessed pt with each administration of medication. RN also had rapid response team visit pt 2 times today. RN will continue to monitor pt and report baseline findings to night shift RN. Rapid response RN reported she will have her team member visit pt at beginning of night shift to be aware of pt's starting baseline.   Wrist and waist estraints were initiated at the beginning of day shift, but per RN's assessment they were removed around 0930 and left at bedside incase pt's baseline worsened.   RN will continue to monitor pt.

## 2019-03-25 NOTE — Consult Note (Addendum)
NAME:  Ryan Duffy, MRN:  [id number], DOB:  06/10/1966, LOS: 1 ADMISSION DATE:  03/24/2019, CONSULTATION DATE:  6/3 REFERRING MD:  CHiU, CHIEF COMPLAINT:  Acute encephalopathy    Brief History   White male with history of polysubstance abuse admitted 6/2 with acute toxic/metabolic encephalopathy felt multifactorial secondary to substance withdrawal and metabolic derangements.  Over the time span from 6/2 to 6/3 he received a total of 22 mg of Versed and 4 mg of Ativan.  Remained intermittently agitated.  Pulmonary asked to evaluate for possible Precedex infusion  History of present illness   53 year old male patient who was admitted on 6/2 after being found laying on floor in bathroom with feces and blood all over him, he was estimated he had been in there for about 24 hours.  On arrival to the emergency room he was agitated, swinging extremities, minimally verbal.  Temperature was 101.4, tachycardic with pulse rate in the 130s.UDS was positive for opiates he had anion gap metabolic acidosis with normal lactic acid and newly identified renal failure with BUN of 76 and creatinine of 4.47, initial set total CK was 5197 he was admitted to the medical ward with a working diagnosis of acute toxic/metabolic encephalopathy felt multifactorial secondary to possible substance withdrawal, further complicated by acute renal failure, Sirs response, hyperkalemia and rhabdo.  Therapeutic interventions included PRN benzodiazepine, supportive IV therapy following crystalloid bolus in the emergency room, telemetry monitoring, and supportive care. Hospital course: 6/2: Arrived to medical ward agitated restless and combative.  Required one-on-one safety sitter, and PRN benzodiazepines 6/3: Rapid response called, since time of admission had received a total of 22 mg of IV Versed and 4 mg of Ativan.  He would be intermittently agitated sitting up, striking at nursing staff, attempting to get out of bed.  Critical care  consulted for consideration about Precedex infusion  Past Medical History  Substance abuse, Anxiety, depression, back pain, GERD, HTN, renal urolithiasis.  ->he is on elavil, baclofen, klonopin as well as effexor at home.  Significant Hospital Events   6/2: Arrived to medical ward agitated restless and combative.  Required one-on-one safety sitter, and PRN benzodiazepines 6/3: Rapid response called, since time of admission had received a total of 22 mg of IV Versed and 4 mg of Ativan.  He would be intermittently agitated sitting up, striking at nursing staff, attempting to get out of bed.  Critical care consulted for consideration about Precedex infusion   Consults:  pulm   Procedures:    Significant Diagnostic Tests:  CT brain 6/3: normal  UDS 6/2: + opioids  Micro Data:  BCX2 6/2>>> UC 6/2>>> Antimicrobials:  Cefepime 6/2>>> Flagyl 6/2>>>6/3 vanc 6/2>>>  Interim history/subjective:  Currently sedated after ativan BUT intermittently agitated   Objective   Blood pressure (Abnormal) 154/76, pulse (Abnormal) 120, temperature 100.2 F (37.9 C), temperature source Axillary, resp. rate 17, SpO2 99 %.        Intake/Output Summary (Last 24 hours) at 03/25/2019 1041 Last data filed at 03/25/2019 0510 Gross per 24 hour  Intake 6335.78 ml  Output no documentation  Net 6335.78 ml   There were no vitals filed for this visit.  Examination: General: 52 year old wm. Restless and agitated at times, Now sedated HENT: NCAT no JVD MM dry  Lungs: clear, decreased bases  Cardiovascular: RRR no MRG Abdomen: soft not tender  Extremities: warm, dry brisk CR no edema  Neuro: sp slurred, moves all ext. Confused, tremulous and agitated at times  GU: cl yellow   Resolved Hospital Problem list     Assessment & Plan:   Acute toxic and metabolic encephalopathy.  Suspect multifactorial. ? Withdrawal: ETOH? BENZOS ?, serotonin syndrome (was on effexor & is tremulous & meets hunter toxicity  criteria on basis of tremor and his Effexor but fever not very impressive), ? Seizure at some point, also has fever so ? Sepsis. Suspect that renal failure and acidosis also contributing.  -UDS only + for opioids  -CT head neg -seemingly controlled currently w/ benzos but 22mg  of versed from last night certainly a concern  Plan/rec Cont CIWA protocol w/ PRN benzos Holding effexor Cont tele  Watching fever curve closely (he had fever on presentation but if this was truly serotonin syndrome would expect fever to persist) Low threshold for adding precedex as adjunct but would also cont some level of benzo support  Ck QTc daily Thiamine and folate EEG  AKI. Presume 2/2 Rhabdo. May have further been exacerbated by ACE-I and NSAIDs -renal fxn improving  Plan Cont bicarb gtt Serial chemistries and TCks I&O Renal dose meds Holding ace-I  Hold NSAIDs  Acute metabolic acidosis (mix of anion gap and non-anion gap acidosis) ->LA negative  Plan Cont bicarb gtt Serial chem  Fluid and electrolyte imbalance: hyperkalemia  -improving  Plan Trend chemistry   SIRS ? Sepsis  -source for infection not clear  Plan Cont vanc and cefepime day 1 Await pending cultures  Stopped flagyl   Abnormal LFTs Plan  Trend  Best practice:  Diet: NPO Pain/Anxiety/Delirium protocol (if indicated): w/d protocol  VAP protocol (if indicated): NA DVT prophylaxis: LMWH GI prophylaxis: NA Glucose control: NA Mobility: NA Code Status: full code  Family Communication:  Disposition: critically ill d/t severe acute TME.  Working dx is W/D, presume also exacerbated by metabolic derangements; will eval again later today. May still need precedex    Labs   CBC: Recent Labs  Lab 03/24/19 1144  WBC 17.6*  NEUTROABS 14.4*  HGB 12.7*  HCT 38.9*  MCV 92.4  PLT 277    Basic Metabolic Panel: Recent Labs  Lab 03/24/19 1144 03/24/19 1800  NA 141 142  K 6.0* 5.8*  CL 102 110  CO2 16* 18*  GLUCOSE  101* 126*  BUN 76* 65*  CREATININE 4.47* 2.51*  CALCIUM 9.3 8.1*   GFR: CrCl cannot be calculated (Unknown ideal weight.). Recent Labs  Lab 03/24/19 1144 03/24/19 1218  WBC 17.6*  --   LATICACIDVEN  --  1.9    Liver Function Tests: Recent Labs  Lab 03/24/19 1144  AST 120*  ALT 63*  ALKPHOS 54  BILITOT 1.6*  PROT 7.4  ALBUMIN 4.0   No results for input(s): LIPASE, AMYLASE in the last 168 hours. No results for input(s): AMMONIA in the last 168 hours.  ABG    Component Value Date/Time   TCO2 27 04/10/2017 2035     Coagulation Profile: No results for input(s): INR, PROTIME in the last 168 hours.  Cardiac Enzymes: Recent Labs  Lab 03/24/19 1338  CKTOTAL 5,197*    HbA1C: No results found for: HGBA1C  CBG: Recent Labs  Lab 03/24/19 1209  GLUCAP 102*    Review of Systems:   Not able   Past Medical History  He,  has a past medical history of Anxiety, Back pain, Depression, GERD (gastroesophageal reflux disease), Hypertension, and Renal stones.   Surgical History    Past Surgical History:  Procedure Laterality Date  . APPENDECTOMY    .  KNEE ARTHROSCOPY W/ MENISCAL REPAIR Bilateral      Social History   reports that he has been smoking e-cigarettes. He has never used smokeless tobacco. He reports that he does not drink alcohol or use drugs.   Family History   His family history includes CVA in his father; HIV in his brother; Heart attack in his father; Hypercholesterolemia in his father and mother; Hypertension in his father and mother; Melanoma in his mother; Renal Disease in his father; Transient ischemic attack in his father.   Allergies Allergies  Allergen Reactions  . Ivp Dye [Iodinated Diagnostic Agents] Anaphylaxis  . Sulfamethoxazole Rash    Other reaction(s): Other (See Comments) [Rash] burning blisters  . Tramadol Nausea And Vomiting  . Sulfa Antibiotics Rash     Home Medications  Prior to Admission medications   Medication Sig  Start Date End Date Taking? Authorizing Provider  amitriptyline (ELAVIL) 50 MG tablet Take 50 mg by mouth at bedtime.   Yes [provider]  baclofen (LIORESAL) 10 MG tablet Take 10 mg by mouth 3 (three) times daily as needed for muscle spasms.  11/28/17  Yes [provider]  clonazePAM (KLONOPIN) 0.5 MG tablet Take 0.5 mg by mouth 2 (two) times daily as needed for anxiety.   Yes [provider]  cyclobenzaprine (FLEXERIL) 5 MG tablet Take 5 mg by mouth 3 (three) times daily as needed for muscle spasms.   Yes [provider]  diclofenac (VOLTAREN) 75 MG EC tablet Take 75 mg by mouth 2 (two) times daily as needed for mild pain.  04/25/18  Yes [provider]  lisinopril (PRINIVIL,ZESTRIL) 10 MG tablet Take 10 mg by mouth daily.   Yes [provider]  venlafaxine XR (EFFEXOR-XR) 150 MG 24 hr capsule Take 150 mg by mouth daily with breakfast.   Yes [provider]  venlafaxine XR (EFFEXOR-XR) 75 MG 24 hr capsule Take 75 mg by mouth daily with breakfast. Take with 150mg  capsule   Yes [provider]  meloxicam (MOBIC) 15 MG tablet Take 1 tablet (15 mg total) by mouth daily. Patient not taking: Reported on 03/24/2019 04/11/17   Ward, Layla MawKristen N, DO  Oxycodone HCl 10 MG TABS Take 10 mg by mouth 4 (four) times daily as needed for severe pain. 03/21/19   [provider]     Critical care time: 32 min     Simonne MartinetPeter E Babcock ACNP-BC [facility] Pulmonary/Critical Care Pager # [phone] OR # [phone] if no answer  Attending Note:  [age] year old male with PMH of polysubstance abuse who presents with toxic/metabolic encephalopathy due to withdrawal and metabolic derangements.  PCCM consulted for agitation.  Patient has received multiple doses of benzos at this point and on exam appears stable and protecting his airway.  No need for pecedex at this time.  PCCM will be available on a PRN bases, if patient deteriorates please feel free to call us  back and we will be happy to transfer as needed.  PCCM will be available PRN.  Acute encephalopathy: - Benzos for withdrawal - No role for precedex at this time - Monitor closely for airway protection  Hyperkalemia: - Kayexalate - BMET in AM  AKI: - Hydrate - BMET in AM  PCCM will sign off, please call back if needed  Alyson ReedyWesam G. Matty Vanroekel, M.D. [facility]/Critical [facility] Medicine. Pager: [phone]. After hours pager: [phone].

## 2019-03-25 NOTE — Significant Event (Addendum)
Rapid Response Event Note  Overview: Ongoing Agitation  Initial Focused Assessment: Called by charge nurse with concern of "level of care" for this patient. Ryan Duffy was found down at his residence, per chart, was down for ~ 24 hours. Since arrival in the ED, patient has been altered/agitated/violent at times -- patient has received a total of Versed 22 mg IV and Ativan 4 mg IV for agitation. Overnight, patient was receiving Versed 2 mg IV almost every hour for several hours.   Upon arrival, patient was sleeping, just received a dose of Versed 2 mg IV. He wakes up spontaneously, mild agitation, is in restraints as well. HR has been 110-130 range, stable BP, 99% on RA. + UOP. Currently breathing is normal, not labored, not in acute distress  Interventions: -- NO RRT INTERVENTIONS  Plan of Care: -- RNs  [facility] MD, updated MD, Versed orders were discontinued and CIWA - Ativan orders were placed. PRN Haldol orders placed.  -- RN to monitor VS closely, monitor for signs of respiratory depression -- High risk for aspiration  -- Low threshold for needing intubation, currently protecting his airway, but is quite altered and encephalopathic.  -- Delirium Precautions (daytime - lights on/blinds open, nighttime - couple care, minimal sleep interruptions, frequent reorientation if possible. -- Aspiration Precautions (suction set up at the bedside, oxygen set up at the bedside, HOB > 30 is possible). -- I have notified the Patient Placement of the situation - incase the patient needs a transfer to the ICU. I also have updated the RT as well.  Event Summary:  Call Time 0732 Arrival Time 0735 End Time 2015   Ryan Duffy R

## 2019-03-25 NOTE — Progress Notes (Signed)
Pharmacy Antibiotic Note  Ryan Duffy is a 53 y.o. male admitted on 03/24/2019 with sepsis.  Last weight 87kg and height 70 inches tall. Pharmacy has been consulted for Vancomycin and Cefepime dosing.  The patient was noted to be in AKI on admission (SCr 4.47, BL<1) - now resolved with SCr 0.92, CrCl~100 ml/min. Will adjust antibiotic doses.   Vancomycin 1250 mg IV Q 12 hrs. Goal AUC 400-550. Expected AUC: 465 SCr used: 0.92   Plan: - Adjust Vancomycin to 1250 mg IV every 12 hours - Continue Cefepime 2g IV every 8 hours - Will continue to follow renal function, culture results, LOT, and antibiotic de-escalation plans    Temp (24hrs), Avg:99.2 F (37.3 C), Min:98.5 F (36.9 C), Max:100.2 F (37.9 C)  Recent Labs  Lab 03/24/19 1144 03/24/19 1218 03/24/19 1800 03/25/19 1138  WBC 17.6*  --   --  5.9  CREATININE 4.47*  --  2.51* 0.92  LATICACIDVEN  --  1.9  --   --     CrCl cannot be calculated (Unknown ideal weight.).    Allergies  Allergen Reactions  . Ivp Dye [Iodinated Diagnostic Agents] Anaphylaxis  . Sulfamethoxazole Rash    Other reaction(s): Other (See Comments) [Rash] burning blisters  . Tramadol Nausea And Vomiting  . Sulfa Antibiotics Rash    Vanc 6/2 >> Cefepime 6/2 >>  6/2 COVID >> neg 6/2 BCx >> ngtd  Thank you for allowing pharmacy to be a part of this patient's care.  Georgina Pillion, PharmD, BCPS Clinical Pharmacist Clinical phone for 03/25/2019: [phone] 03/25/2019 2:19 PM   **Pharmacist phone directory can now be found on [email or web address] ([email or web address]).  Listed under [facility].

## 2019-03-25 NOTE — Progress Notes (Signed)
PROGRESS NOTE    Ryan Duffy  NWG:[id number] DOB: [birth year] DOA: 03/24/2019 PCP: Geronimo Boot, MD    Brief Narrative:  53 y.o. male with medical history significant of anxiety, depression, back pain, GERD, hypertension, renal urolithiasis who is brought to the emergency department via EMS after his roommate called saying that the patient not being in the bathroom for approximately 24 hours.  When EMS arrived, the patient was laying on the bathroom floor with feces and blood on him.  He is unable to provide further information at this time.  I spoke to the patient's mother, who says in the past he had had trouble with substance abuse, but he has never had an instance like this.  She last spoke to him on Sunday.  She states that during the weekend he went out with some friends, but according to her, they do not have any further information.  The patient has been very restless in the emergency department.  He defecated on the bed.  He has been at times swinging his extremities and has nearly hit the staff a few times.  ED Course: Initial vital signs temperature 101.4 F, pulse 132, respiration 19, blood pressure 111/54 mmHg O2 sat 100% on room air.  The patient has been given 3000 mL of NS bolus, lorazepam 2 mg IVP x1 and an initial dose of Zosyn.  I added on another 1000 mL normal saline bolus.  I also had to give him another 2 mg of lorazepam due to continuous restlessness, which was followed by Versed 4 mg IVP.  Later after this, the patient was given Benadryl 25 mg IVP x1.  I started him on four-point soft restraints.  He has had a one-to-one sitter by bedside.  His urinalysis had a cloudy appearance with moderate hemoglobinuria, ketonuria 20 mg/dL and a few bacteria on microscopic examination.  UDS was positive for opiates, but the patient takes oxycodone as needed at home.  CBC showed a white count of 17.6 with 81% neutrophils, hemoglobin 12.7 g/dL and platelets 578.  Lactic acid was normal.   Sodium 141, potassium 6.0, chloride 102 and CO2 16 mmol/L.  His anion gap was 23.  Glucose 101, BUN 76 and creatinine 4.47 mg/dL.  AST was 120 and ALT 63 units/L.  Total bilirubin was mildly elevated at 1.6 units/L.` He had a total CK of 5197 units/L.  Alcohol level was normal.  Acetaminophen and salicylate levels are still pending.  Assessment & Plan:   Principal Problem:   Acute encephalopathy Active Problems:   Rhabdomyolysis   AKI (acute kidney injury) (HCC)   Hyperkalemia   Depression   GERD (gastroesophageal reflux disease)   SIRS (systemic inflammatory response syndrome) (HCC)  Principal Problem:   Acute toxic metabolic encephalopathy -Pt presented with ARF, elevated CK. Unable to obtain full history at this time -From chart review, pt noted to be on muliple depression meds and has hx of depression, thoughts of harming self in the past -ETOH neg. Per history, doubt etoh abuse, thus withdrawal. However, possibility for serotonin syndrome given current home meds. Will need to obtain better history -Neuro checks every 2 hours, then every 4 hours if stable. -Continue IV fluids. -Continue treatment with antibiotics for possible SIRS.  Active Problems:   SIRS (systemic inflammatory response syndrome) (HCC) No obvious source of infection. Remains febrile. Cont tylenol as needed -Cont empiric abx for now, pending clinical improvement    Rhabdomyolysis Continue sodium bicarb infusion given azotemia induced acidosis and hyperkalemia. Monitor  intake and output. -Cr improving    AKI (acute kidney injury) (HCC) Continue IV fluids as tolerated Renal function improving. Labs reviewed    Hyperkalemia  Noted at time of presentation Normalized    Depression Cont to hold antidepressant per above    GERD (gastroesophageal reflux disease) Protonix 40 mg IVP every 24 hours. Stable at this time  DVT prophylaxis: Lovenox subQ Code Status: Full Family Communication: Pt in  room, family not at bedside Disposition Plan: Uncertain at this time  Consultants:   PCCM  Procedures:     Antimicrobials: Anti-infectives (From admission, onward)   Start     Dose/Rate Route Frequency Ordered Stop   03/25/19 2100  vancomycin (VANCOCIN) IVPB 1000 mg/200 mL premix     1,000 mg 200 mL/hr over 60 Minutes Intravenous Every 24 hours 03/24/19 2056     03/25/19 0400  ceFEPIme (MAXIPIME) 2 g in sodium chloride 0.9 % 100 mL IVPB     2 g 200 mL/hr over 30 Minutes Intravenous Every 8 hours 03/24/19 2056     03/24/19 1930  ceFEPIme (MAXIPIME) 2 g in sodium chloride 0.9 % 100 mL IVPB     2 g 200 mL/hr over 30 Minutes Intravenous  Once 03/24/19 1918 03/24/19 2020   03/24/19 1930  metroNIDAZOLE (FLAGYL) IVPB 500 mg  Status:  Discontinued     500 mg 100 mL/hr over 60 Minutes Intravenous Every 8 hours 03/24/19 1918 03/25/19 1121   03/24/19 1930  vancomycin (VANCOCIN) 1,500 mg in sodium chloride 0.9 % 500 mL IVPB     1,500 mg 250 mL/hr over 120 Minutes Intravenous  Once 03/24/19 1918 03/24/19 2237   03/24/19 1630  piperacillin-tazobactam (ZOSYN) IVPB 3.375 g     3.375 g 150 mL/hr over 30 Minutes Intravenous  Once 03/24/19 1616 03/24/19 1812       Subjective: Unable to obtain. Pt encephalopathic  Objective: Vitals:   03/25/19 0514 03/25/19 0939 03/25/19 1000 03/25/19 1313  BP: (!) 154/76  139/81 130/69  Pulse: (!) 120  (!) 117 (!) 119  Resp: 17  (!) 21 (!) 23  Temp: 98.5 F (36.9 C) 100.2 F (37.9 C)  99.7 F (37.6 C)  TempSrc: Axillary Axillary  Axillary  SpO2: 99%  96% 94%    Intake/Output Summary (Last 24 hours) at 03/25/2019 1354 Last data filed at 03/25/2019 0510 Gross per 24 hour  Intake 5335.78 ml  Output --  Net 5335.78 ml   There were no vitals filed for this visit.  Examination:  General exam: Appears calm and comfortable  Respiratory system: Clear to auscultation. Respiratory effort normal. Cardiovascular system: S1 & S2 heard,  RRR Gastrointestinal system: Abdomen is nondistended, soft and nontender. No organomegaly or masses felt. Normal bowel sounds heard. Central nervous system: Alert and oriented. No focal neurological deficits. Extremities: Symmetric 5 x 5 power. Skin: No rashes, lesions Psychiatry: Judgement and insight appear normal. Mood & affect appropriate.   Data Reviewed: I have personally reviewed following labs and imaging studies  CBC: Recent Labs  Lab 03/24/19 1144 03/25/19 1138  WBC 17.6* 5.9  NEUTROABS 14.4* 4.6  HGB 12.7* 18.0*  HCT 38.9* 53.0*  MCV 92.4 88.5  PLT 277 108*   Basic Metabolic Panel: Recent Labs  Lab 03/24/19 1144 03/24/19 1800 03/25/19 1138  NA 141 142 145  K 6.0* 5.8* 4.5  CL 102 110 108  CO2 16* 18* 28  GLUCOSE 101* 126* 170*  BUN 76* 65* 27*  CREATININE 4.47* 2.51* 0.92  CALCIUM 9.3 8.1* 8.3*   GFR: CrCl cannot be calculated (Unknown ideal weight.). Liver Function Tests: Recent Labs  Lab 03/24/19 1144 03/25/19 1138  AST 120* 101*  ALT 63* 52*  ALKPHOS 54 44  BILITOT 1.6* 1.1  PROT 7.4 5.7*  ALBUMIN 4.0 2.8*   No results for input(s): LIPASE, AMYLASE in the last 168 hours. No results for input(s): AMMONIA in the last 168 hours. Coagulation Profile: No results for input(s): INR, PROTIME in the last 168 hours. Cardiac Enzymes: Recent Labs  Lab 03/24/19 1338 03/25/19 1138  CKTOTAL 5,197* 3,850*   BNP (last 3 results) No results for input(s): PROBNP in the last 8760 hours. HbA1C: No results for input(s): HGBA1C in the last 72 hours. CBG: Recent Labs  Lab 03/24/19 1209  GLUCAP 102*   Lipid Profile: No results for input(s): CHOL, HDL, LDLCALC, TRIG, CHOLHDL, LDLDIRECT in the last 72 hours. Thyroid Function Tests: No results for input(s): TSH, T4TOTAL, FREET4, T3FREE, THYROIDAB in the last 72 hours. Anemia Panel: No results for input(s): VITAMINB12, FOLATE, FERRITIN, TIBC, IRON, RETICCTPCT in the last 72 hours. Sepsis Labs: Recent  Labs  Lab 03/24/19 1218  LATICACIDVEN 1.9    Recent Results (from the past 240 hour(s))  Blood culture (routine x 2)     Status: None (Preliminary result)   Collection Time: 03/24/19 11:50 AM  Result Value Ref Range Status   Specimen Description BLOOD BLOOD RIGHT FOREARM  Final   Special Requests   Final    BOTTLES DRAWN AEROBIC AND ANAEROBIC Blood Culture adequate volume   Culture   Final    NO GROWTH < 24 HOURS Performed at [facility], [address] [place], [place] [address]    Report Status PENDING  Incomplete  SARS Coronavirus 2 (CEPHEID- Performed in [facility] hospital lab), Hosp Order     Status: None   Collection Time: 03/24/19 12:21 PM  Result Value Ref Range Status   SARS Coronavirus 2 NEGATIVE NEGATIVE Final    Comment: (NOTE) If result is NEGATIVE SARS-CoV-2 target nucleic acids are NOT DETECTED. The SARS-CoV-2 RNA is generally detectable in upper and lower  respiratory specimens during the acute phase of infection. The lowest  concentration of SARS-CoV-2 viral copies this assay can detect is 250  copies / mL. A negative result does not preclude SARS-CoV-2 infection  and should not be used as the sole basis for treatment or other  patient management decisions.  A negative result may occur with  improper specimen collection / handling, submission of specimen other  than nasopharyngeal swab, presence of viral mutation(s) within the  areas targeted by this assay, and inadequate number of viral copies  (<250 copies / mL). A negative result must be combined with clinical  observations, patient history, and epidemiological information. If result is POSITIVE SARS-CoV-2 target nucleic acids are DETECTED. The SARS-CoV-2 RNA is generally detectable in upper and lower  respiratory specimens dur ing the acute phase of infection.  Positive  results are indicative of active infection with SARS-CoV-2.  Clinical  correlation with patient history and other diagnostic  information is  necessary to determine patient infection status.  Positive results do  not rule out bacterial infection or co-infection with other viruses. If result is PRESUMPTIVE POSTIVE SARS-CoV-2 nucleic acids MAY BE PRESENT.   A presumptive positive result was obtained on the submitted specimen  and confirmed on repeat testing.  While 2019 novel coronavirus  (SARS-CoV-2) nucleic acids may be present in the submitted  sample  additional confirmatory testing may be necessary for epidemiological  and / or clinical management purposes  to differentiate between  SARS-CoV-2 and other Sarbecovirus currently known to infect humans.  If clinically indicated additional testing with an alternate test  methodology [phone]) is advised. The SARS-CoV-2 RNA is generally  detectable in upper and lower respiratory sp ecimens during the acute  phase of infection. The expected result is Negative. Fact Sheet for Patients:  BoilerBrush.com.cy Fact Sheet for Healthcare Providers: https://pope.com/ This test is not yet approved or cleared by the [place] FDA and has been authorized for detection and/or diagnosis of SARS-CoV-2 by FDA under an Emergency Use Authorization (EUA).  This EUA will remain in effect (meaning this test can be used) for the duration of the COVID-19 declaration under Section 564(b)(1) of the Act, 21 U.S.C. section 360bbb-3(b)(1), unless the authorization is terminated or revoked sooner. Performed at [facility], [address] [place], [place] [address]   Blood culture (routine x 2)     Status: None (Preliminary result)   Collection Time: 03/24/19 10:05 PM  Result Value Ref Range Status   Specimen Description BLOOD RIGHT HAND  Final   Special Requests   Final    BOTTLES DRAWN AEROBIC ONLY Blood Culture results may not be optimal due to an excessive volume of blood received in culture bottles   Culture   Final    NO  GROWTH < 12 HOURS Performed at [facility], [address] [place], [place] [address]    Report Status PENDING  Incomplete     Radiology Studies: Ct Head Wo Contrast  Result Date: 03/24/2019 CLINICAL DATA:  Encephalopathy. EXAM: CT HEAD WITHOUT CONTRAST TECHNIQUE: Contiguous axial images were obtained from the base of the skull through the vertex without intravenous contrast. COMPARISON:  04/10/2017 FINDINGS: Brain: Normal. Vascular: No hyperdense vessel or unexpected calcification. Skull: Normal. Sinuses/Orbits: Normal. Other: None IMPRESSION: Normal exam. Electronically Signed   By: Francene Boyers M.D.   On: 03/24/2019 15:08   Dg Chest Portable 1 View  Result Date: 03/24/2019 CLINICAL DATA:  Fevers, altered mental status EXAM: PORTABLE CHEST 1 VIEW COMPARISON:  04/10/2017 FINDINGS: The heart size and mediastinal contours are within normal limits. Both lungs are clear. The visualized skeletal structures are unremarkable. IMPRESSION: No active disease. Electronically Signed   By: Elige Ko   On: 03/24/2019 12:46    Scheduled Meds:  enoxaparin (LOVENOX) injection  40 mg Subcutaneous Q24H   folic acid  1 mg Oral Daily   LORazepam  0-4 mg Intravenous Q6H   Followed by   Melene Muller ON 03/27/2019] LORazepam  0-4 mg Intravenous Q12H   multivitamin with minerals  1 tablet Oral Daily   thiamine  100 mg Oral Daily   Or   thiamine  100 mg Intravenous Daily   Continuous Infusions:  ceFEPime (MAXIPIME) IV 2 g (03/25/19 1335)    sodium bicarbonate  infusion 1000 mL 150 mL/hr at 03/25/19 1042   vancomycin       LOS: 1 day   Rickey Barbara, MD [facility] Pager On Amion  If 7PM-7AM, please contact night-coverage 03/25/2019, 1:54 PM

## 2019-03-26 LAB — CBC WITH DIFFERENTIAL/PLATELET
Abs Immature Granulocytes: 0.03 10*3/uL (ref 0.00–0.07)
Basophils Absolute: 0.1 10*3/uL (ref 0.0–0.1)
Basophils Relative: 0 %
Eosinophils Absolute: 0.1 10*3/uL (ref 0.0–0.5)
Eosinophils Relative: 1 %
HCT: 35.3 % — ABNORMAL LOW (ref 39.0–52.0)
Hemoglobin: 12 g/dL — ABNORMAL LOW (ref 13.0–17.0)
Immature Granulocytes: 0 %
Lymphocytes Relative: 14 %
Lymphs Abs: 1.5 10*3/uL (ref 0.7–4.0)
MCH: 30.5 pg (ref 26.0–34.0)
MCHC: 34 g/dL (ref 30.0–36.0)
MCV: 89.8 fL (ref 80.0–100.0)
Monocytes Absolute: 1.3 10*3/uL — ABNORMAL HIGH (ref 0.1–1.0)
Monocytes Relative: 11 %
Neutro Abs: 8.3 10*3/uL — ABNORMAL HIGH (ref 1.7–7.7)
Neutrophils Relative %: 74 %
Platelets: 191 10*3/uL (ref 150–400)
RBC: 3.93 MIL/uL — ABNORMAL LOW (ref 4.22–5.81)
RDW: 13.4 % (ref 11.5–15.5)
WBC: 11.2 10*3/uL — ABNORMAL HIGH (ref 4.0–10.5)
nRBC: 0 % (ref 0.0–0.2)

## 2019-03-26 LAB — COMPREHENSIVE METABOLIC PANEL
ALT: 54 U/L — ABNORMAL HIGH (ref 0–44)
AST: 75 U/L — ABNORMAL HIGH (ref 15–41)
Albumin: 2.6 g/dL — ABNORMAL LOW (ref 3.5–5.0)
Alkaline Phosphatase: 44 U/L (ref 38–126)
Anion gap: 10 (ref 5–15)
BUN: 11 mg/dL (ref 6–20)
CO2: 27 mmol/L (ref 22–32)
Calcium: 8.4 mg/dL — ABNORMAL LOW (ref 8.9–10.3)
Chloride: 106 mmol/L (ref 98–111)
Creatinine, Ser: 0.76 mg/dL (ref 0.61–1.24)
GFR calc Af Amer: >60 mL/min (ref >60)
GFR calc non Af Amer: >60 mL/min (ref >60)
Glucose, Bld: 141 mg/dL — ABNORMAL HIGH (ref 70–99)
Potassium: 3.3 mmol/L — ABNORMAL LOW (ref 3.5–5.1)
Sodium: 143 mmol/L (ref 135–145)
Total Bilirubin: 1.2 mg/dL (ref 0.3–1.2)
Total Protein: 5.6 g/dL — ABNORMAL LOW (ref 6.5–8.1)

## 2019-03-26 LAB — CK: Total CK: 1857 U/L — ABNORMAL HIGH (ref 49–397)

## 2019-03-26 LAB — HIV ANTIBODY (ROUTINE TESTING W REFLEX): HIV Screen 4th Generation wRfx: NONREACTIVE

## 2019-03-26 MED ORDER — SODIUM CHLORIDE 0.9% FLUSH: 10.0000 mL | INTRAVENOUS | Status: DC | PRN

## 2019-03-26 MED ORDER — POTASSIUM CHLORIDE 10 MEQ/100ML IV SOLN
10.0000 meq | INTRAVENOUS | Status: AC
  Administered 2019-03-26 (×4): 10 meq via INTRAVENOUS
  Filled 2019-03-26 (×4): qty 100

## 2019-03-26 MED ORDER — POTASSIUM CHLORIDE CRYS ER 20 MEQ PO TBCR: 40.0000 meq | EXTENDED_RELEASE_TABLET | Freq: Once | ORAL | Status: DC

## 2019-03-26 MED ORDER — LACTATED RINGERS IV SOLN
INTRAVENOUS | Status: DC
  Administered 2019-03-26 – 2019-03-27 (×2): via INTRAVENOUS

## 2019-03-26 MED ORDER — HALOPERIDOL LACTATE 5 MG/ML IJ SOLN
5.0000 mg | Freq: Once | INTRAMUSCULAR | Status: AC
  Administered 2019-03-26: 5 mg via INTRAVENOUS
  Filled 2019-03-26: qty 1

## 2019-03-26 NOTE — Plan of Care (Signed)
  Problem: Education: Goal: Knowledge of General Education information will improve Description Including pain rating scale, medication(s)/side effects and non-pharmacologic comfort measures Outcome: Not Progressing   Problem: Health Behavior/Discharge Planning: Goal: Ability to manage health-related needs will improve Outcome: Not Progressing   Problem: Clinical Measurements: Goal: Ability to maintain clinical measurements within normal limits will improve Outcome: Not Progressing Goal: Will remain free from infection Outcome: Not Progressing Goal: Diagnostic test results will improve Outcome: Not Progressing Goal: Respiratory complications will improve Outcome: Not Progressing Goal: Cardiovascular complication will be avoided Outcome: Not Progressing   Problem: Activity: Goal: Risk for activity intolerance will decrease Outcome: Not Progressing   Problem: Nutrition: Goal: Adequate nutrition will be maintained Outcome: Not Progressing   Problem: Coping: Goal: Level of anxiety will decrease Outcome: Not Progressing   Problem: Elimination: Goal: Will not experience complications related to bowel motility Outcome: Not Progressing Goal: Will not experience complications related to urinary retention Outcome: Not Progressing   Problem: Pain Managment: Goal: General experience of comfort will improve Outcome: Not Progressing   Problem: Safety: Goal: Ability to remain free from injury will improve Outcome: Not Progressing   Problem: Skin Integrity: Goal: Risk for impaired skin integrity will decrease Outcome: Not Progressing   Problem: Education: Goal: Knowledge of disease or condition will improve Outcome: Not Progressing Goal: Understanding of discharge needs will improve Outcome: Not Progressing   Problem: Physical Regulation: Goal: Complications related to the disease process, condition or treatment will be avoided or minimized Outcome: Not Progressing    Problem: Safety: Goal: Ability to remain free from injury will improve Outcome: Not Progressing

## 2019-03-26 NOTE — Progress Notes (Signed)
Patient  with continued agitation ,  restlessness and vigorous movement . Patient Confused, with poor safety awareness  and unable to Reorient / redirect. Pt noted to have pulled IV catheter.  Ativan 3mg  given for CIWA score of 20  and PRN Haldol given with minimal relief.  Rapid Response Nurse and Hospialist on call  updated on pt's progress. To continue to monitor and treat pt per MD and nursing orders

## 2019-03-26 NOTE — Progress Notes (Signed)
Patient continues to exhibit restlessness and agitation. Patient noted to have bite off IV tubing. Patient with numerous attempts to remove mitten restraints. Pt Alert to self only. He has poor safety awareness an unable to reorient / redirect.  One time order Haldol given  with  Minimal relief

## 2019-03-26 NOTE — Progress Notes (Signed)
Responded to PIV consult. Sitter reports pt just now calm after pulling at medical equipment recently. Spoke to RN about access needs. RN stated 1 PIV will be appropriate for now but may need 2nd line later due to doses of incompatible medications due. RN to re-enter consult if/when needed.

## 2019-03-26 NOTE — Progress Notes (Signed)
PROGRESS NOTE    Teddrick Mallari  WUJ:[id number] DOB: [birth year] DOA: 03/24/2019 PCP: Geronimo Boot, MD    Brief Narrative:  53 y.o. male with medical history significant of anxiety, depression, back pain, GERD, hypertension, renal urolithiasis who is brought to the emergency department via EMS after his roommate called saying that the patient not being in the bathroom for approximately 24 hours.  When EMS arrived, the patient was laying on the bathroom floor with feces and blood on him.  He is unable to provide further information at this time.  I spoke to the patient's mother, who says in the past he had had trouble with substance abuse, but he has never had an instance like this.  She last spoke to him on Sunday.  She states that during the weekend he went out with some friends, but according to her, they do not have any further information.  The patient has been very restless in the emergency department.  He defecated on the bed.  He has been at times swinging his extremities and has nearly hit the staff a few times.  ED Course: Initial vital signs temperature 101.4 F, pulse 132, respiration 19, blood pressure 111/54 mmHg O2 sat 100% on room air.  The patient has been given 3000 mL of NS bolus, lorazepam 2 mg IVP x1 and an initial dose of Zosyn.  I added on another 1000 mL normal saline bolus.  I also had to give him another 2 mg of lorazepam due to continuous restlessness, which was followed by Versed 4 mg IVP.  Later after this, the patient was given Benadryl 25 mg IVP x1.  I started him on four-point soft restraints.  He has had a one-to-one sitter by bedside.  His urinalysis had a cloudy appearance with moderate hemoglobinuria, ketonuria 20 mg/dL and a few bacteria on microscopic examination.  UDS was positive for opiates, but the patient takes oxycodone as needed at home.  CBC showed a white count of 17.6 with 81% neutrophils, hemoglobin 12.7 g/dL and platelets 956.  Lactic acid was normal.   Sodium 141, potassium 6.0, chloride 102 and CO2 16 mmol/L.  His anion gap was 23.  Glucose 101, BUN 76 and creatinine 4.47 mg/dL.  AST was 120 and ALT 63 units/L.  Total bilirubin was mildly elevated at 1.6 units/L.` He had a total CK of 5197 units/L.  Alcohol level was normal.  Acetaminophen and salicylate levels are still pending.  Assessment & Plan:   Principal Problem:   Acute encephalopathy Active Problems:   Rhabdomyolysis   AKI (acute kidney injury) (HCC)   Hyperkalemia   Depression   GERD (gastroesophageal reflux disease)   SIRS (systemic inflammatory response syndrome) (HCC)  Principal Problem:   Acute toxic metabolic encephalopathy -Pt presented with ARF, elevated CK. Unable to obtain full history at this time -From chart review, pt noted to be on muliple depression meds and has hx of depression, thoughts of harming self in the past -ETOH neg. Per history, doubt etoh abuse, thus withdrawal. However, possibility for serotonin syndrome given current home meds. Will need to obtain better history when patient is more awake -Had discussed with patient's mother who is concerned pt may be taking "pills," specifically narcotics given his past drug abuse -Continued on IV fluids. -Given continued fevers, continue empiric broad abx  Active Problems:   SIRS (systemic inflammatory response syndrome) (HCC) -No obvious source of infection. -Remains febrile. Cont tylenol as needed -Cont empiric abx for now per above, pending  improvement in symptoms    Rhabdomyolysis -Continue sodium bicarb infusion given azotemia induced acidosis and hyperkalemia. Monitor intake and output. -Cr normalized    AKI (acute kidney injury) (HCC) Continue IV fluids as tolerated Renal function normalized Repeat bmet in AM    Hyperkalemia  Noted at time of presentation Normalized, repeat bmet in AM    Depression Cont to hold antidepressant per above    GERD (gastroesophageal reflux disease)  Protonix 40 mg IVP every 24 hours. Stable at this time  DVT prophylaxis: Lovenox subQ Code Status: Full Family Communication: Pt in room, family not at bedside, updated patient's mother 6/3 Disposition Plan: Uncertain at this time  Consultants:   PCCM  Procedures:     Antimicrobials: Anti-infectives (From admission, onward)   Start     Dose/Rate Route Frequency Ordered Stop   03/25/19 2100  vancomycin (VANCOCIN) IVPB 1000 mg/200 mL premix  Status:  Discontinued     1,000 mg 200 mL/hr over 60 Minutes Intravenous Every 24 hours 03/24/19 2056 03/25/19 1417   03/25/19 2000  vancomycin (VANCOCIN) 1,250 mg in sodium chloride 0.9 % 250 mL IVPB     1,250 mg 166.7 mL/hr over 90 Minutes Intravenous Every 12 hours 03/25/19 1417     03/25/19 0400  ceFEPIme (MAXIPIME) 2 g in sodium chloride 0.9 % 100 mL IVPB     2 g 200 mL/hr over 30 Minutes Intravenous Every 8 hours 03/24/19 2056     03/24/19 1930  ceFEPIme (MAXIPIME) 2 g in sodium chloride 0.9 % 100 mL IVPB     2 g 200 mL/hr over 30 Minutes Intravenous  Once 03/24/19 1918 03/24/19 2020   03/24/19 1930  metroNIDAZOLE (FLAGYL) IVPB 500 mg  Status:  Discontinued     500 mg 100 mL/hr over 60 Minutes Intravenous Every 8 hours 03/24/19 1918 03/25/19 1121   03/24/19 1930  vancomycin (VANCOCIN) 1,500 mg in sodium chloride 0.9 % 500 mL IVPB     1,500 mg 250 mL/hr over 120 Minutes Intravenous  Once 03/24/19 1918 03/24/19 2237   03/24/19 1630  piperacillin-tazobactam (ZOSYN) IVPB 3.375 g     3.375 g 150 mL/hr over 30 Minutes Intravenous  Once 03/24/19 1616 03/24/19 1812      Subjective: Cannot obtain. Patient remains encephalopathic  Objective: Vitals:   03/26/19 0655 03/26/19 0818 03/26/19 1043 03/26/19 1208  BP: (!) 147/90     Pulse:      Resp: (!) 31     Temp:  100.2 F (37.9 C) (!) 100.8 F (38.2 C) 99.8 F (37.7 C)  TempSrc:  Axillary Axillary Rectal  SpO2:        Intake/Output Summary (Last 24 hours) at 03/26/2019 1531  Last data filed at 03/26/2019 1034 Gross per 24 hour  Intake 2663.18 ml  Output 200 ml  Net 2463.18 ml   There were no vitals filed for this visit.  Examination: General exam: Asleep, laying in bed, in nad Respiratory system: Normal respiratory effort, no wheezing Cardiovascular system: regular rate, s1, s2 Gastrointestinal system: Soft, nondistended, positive BS Central nervous system: CN2-12 grossly intact, strength intact Extremities: Perfused, no clubbing Skin: Normal skin turgor, no notable skin lesions seen Psychiatry: Unable to assess given mentation  Data Reviewed: I have personally reviewed following labs and imaging studies  CBC: Recent Labs  Lab 03/24/19 1144 03/25/19 1138 03/26/19 0727  WBC 17.6* 5.9 11.2*  NEUTROABS 14.4* 4.6 8.3*  HGB 12.7* 18.0* 12.0*  HCT 38.9* 53.0* 35.3*  MCV 92.4 88.5  89.8  PLT 277 108* 191   Basic Metabolic Panel: Recent Labs  Lab 03/24/19 1144 03/24/19 1800 03/25/19 1138 03/26/19 0727  NA 141 142 145 143  K 6.0* 5.8* 4.5 3.3*  CL 102 110 108 106  CO2 16* 18* 28 27  GLUCOSE 101* 126* 170* 141*  BUN 76* 65* 27* 11  CREATININE 4.47* 2.51* 0.92 0.76  CALCIUM 9.3 8.1* 8.3* 8.4*   GFR: CrCl cannot be calculated (Unknown ideal weight.). Liver Function Tests: Recent Labs  Lab 03/24/19 1144 03/25/19 1138 03/26/19 0727  AST 120* 101* 75*  ALT 63* 52* 54*  ALKPHOS 54 44 44  BILITOT 1.6* 1.1 1.2  PROT 7.4 5.7* 5.6*  ALBUMIN 4.0 2.8* 2.6*   No results for input(s): LIPASE, AMYLASE in the last 168 hours. No results for input(s): AMMONIA in the last 168 hours. Coagulation Profile: No results for input(s): INR, PROTIME in the last 168 hours. Cardiac Enzymes: Recent Labs  Lab 03/24/19 1338 03/25/19 1138 03/26/19 0727  CKTOTAL 5,197* 3,850* 1,857*   BNP (last 3 results) No results for input(s): PROBNP in the last 8760 hours. HbA1C: No results for input(s): HGBA1C in the last 72 hours. CBG: Recent Labs  Lab 03/24/19  1209  GLUCAP 102*   Lipid Profile: No results for input(s): CHOL, HDL, LDLCALC, TRIG, CHOLHDL, LDLDIRECT in the last 72 hours. Thyroid Function Tests: No results for input(s): TSH, T4TOTAL, FREET4, T3FREE, THYROIDAB in the last 72 hours. Anemia Panel: No results for input(s): VITAMINB12, FOLATE, FERRITIN, TIBC, IRON, RETICCTPCT in the last 72 hours. Sepsis Labs: Recent Labs  Lab 03/24/19 1218  LATICACIDVEN 1.9    Recent Results (from the past 240 hour(s))  Blood culture (routine x 2)     Status: None (Preliminary result)   Collection Time: 03/24/19 11:50 AM  Result Value Ref Range Status   Specimen Description BLOOD BLOOD RIGHT FOREARM  Final   Special Requests   Final    BOTTLES DRAWN AEROBIC AND ANAEROBIC Blood Culture adequate volume   Culture   Final    NO GROWTH 2 DAYS Performed at [facility], [address] [place], [place] [address]    Report Status PENDING  Incomplete  SARS Coronavirus 2 (CEPHEID- Performed in [facility] hospital lab), Hosp Order     Status: None   Collection Time: 03/24/19 12:21 PM  Result Value Ref Range Status   SARS Coronavirus 2 NEGATIVE NEGATIVE Final    Comment: (NOTE) If result is NEGATIVE SARS-CoV-2 target nucleic acids are NOT DETECTED. The SARS-CoV-2 RNA is generally detectable in upper and lower  respiratory specimens during the acute phase of infection. The lowest  concentration of SARS-CoV-2 viral copies this assay can detect is 250  copies / mL. A negative result does not preclude SARS-CoV-2 infection  and should not be used as the sole basis for treatment or other  patient management decisions.  A negative result may occur with  improper specimen collection / handling, submission of specimen other  than nasopharyngeal swab, presence of viral mutation(s) within the  areas targeted by this assay, and inadequate number of viral copies  (<250 copies / mL). A negative result must be combined with clinical  observations,  patient history, and epidemiological information. If result is POSITIVE SARS-CoV-2 target nucleic acids are DETECTED. The SARS-CoV-2 RNA is generally detectable in upper and lower  respiratory specimens dur ing the acute phase of infection.  Positive  results are indicative of active infection with SARS-CoV-2.  Clinical  correlation with patient history and other diagnostic information is  necessary to determine patient infection status.  Positive results do  not rule out bacterial infection or co-infection with other viruses. If result is PRESUMPTIVE POSTIVE SARS-CoV-2 nucleic acids MAY BE PRESENT.   A presumptive positive result was obtained on the submitted specimen  and confirmed on repeat testing.  While 2019 novel coronavirus  (SARS-CoV-2) nucleic acids may be present in the submitted sample  additional confirmatory testing may be necessary for epidemiological  and / or clinical management purposes  to differentiate between  SARS-CoV-2 and other Sarbecovirus currently known to infect humans.  If clinically indicated additional testing with an alternate test  methodology [phone]) is advised. The SARS-CoV-2 RNA is generally  detectable in upper and lower respiratory sp ecimens during the acute  phase of infection. The expected result is Negative. Fact Sheet for Patients:  BoilerBrush.com.cy Fact Sheet for Healthcare Providers: https://pope.com/ This test is not yet approved or cleared by the [place] FDA and has been authorized for detection and/or diagnosis of SARS-CoV-2 by FDA under an Emergency Use Authorization (EUA).  This EUA will remain in effect (meaning this test can be used) for the duration of the COVID-19 declaration under Section 564(b)(1) of the Act, 21 U.S.C. section 360bbb-3(b)(1), unless the authorization is terminated or revoked sooner. Performed at [facility], [address] [place], [place]  [address]   Blood culture (routine x 2)     Status: None (Preliminary result)   Collection Time: 03/24/19 10:05 PM  Result Value Ref Range Status   Specimen Description BLOOD RIGHT HAND  Final   Special Requests   Final    BOTTLES DRAWN AEROBIC ONLY Blood Culture results may not be optimal due to an excessive volume of blood received in culture bottles   Culture   Final    NO GROWTH 2 DAYS Performed at [facility], [address] [place], [place] [address]    Report Status PENDING  Incomplete     Radiology Studies: No results found.  Scheduled Meds: . enoxaparin (LOVENOX) injection  40 mg Subcutaneous Q24H  . folic acid  1 mg Oral Daily  . LORazepam  0-4 mg Intravenous Q6H   Followed by  . [START ON 03/27/2019] LORazepam  0-4 mg Intravenous Q12H  . multivitamin with minerals  1 tablet Oral Daily  . potassium chloride  40 mEq Oral Once  . thiamine  100 mg Oral Daily   Or  . thiamine  100 mg Intravenous Daily   Continuous Infusions: . ceFEPime (MAXIPIME) IV 2 g (03/26/19 0740)  .  sodium bicarbonate  infusion 1000 mL 150 mL/hr at 03/26/19 1200  . vancomycin 1,250 mg (03/26/19 0926)     LOS: 2 days   Rickey Barbara, MD [facility] Pager On Amion  If 7PM-7AM, please contact night-coverage 03/26/2019, 3:31 PM

## 2019-03-27 DIAGNOSIS — R4182 Altered mental status, unspecified: Secondary | ICD-10-CM

## 2019-03-27 LAB — CBC WITH DIFFERENTIAL/PLATELET
Abs Immature Granulocytes: 0.02 10*3/uL (ref 0.00–0.07)
Basophils Absolute: 0.1 10*3/uL (ref 0.0–0.1)
Basophils Relative: 1 %
Eosinophils Absolute: 0.2 10*3/uL (ref 0.0–0.5)
Eosinophils Relative: 2 %
HCT: 34.5 % — ABNORMAL LOW (ref 39.0–52.0)
Hemoglobin: 11.5 g/dL — ABNORMAL LOW (ref 13.0–17.0)
Immature Granulocytes: 0 %
Lymphocytes Relative: 16 %
Lymphs Abs: 1.7 10*3/uL (ref 0.7–4.0)
MCH: 30.2 pg (ref 26.0–34.0)
MCHC: 33.3 g/dL (ref 30.0–36.0)
MCV: 90.6 fL (ref 80.0–100.0)
Monocytes Absolute: 1.1 10*3/uL — ABNORMAL HIGH (ref 0.1–1.0)
Monocytes Relative: 11 %
Neutro Abs: 7.7 10*3/uL (ref 1.7–7.7)
Neutrophils Relative %: 70 %
Platelets: 182 10*3/uL (ref 150–400)
RBC: 3.81 MIL/uL — ABNORMAL LOW (ref 4.22–5.81)
RDW: 13.2 % (ref 11.5–15.5)
WBC: 10.8 10*3/uL — ABNORMAL HIGH (ref 4.0–10.5)
nRBC: 0 % (ref 0.0–0.2)

## 2019-03-27 LAB — COMPREHENSIVE METABOLIC PANEL
ALT: 52 U/L — ABNORMAL HIGH (ref 0–44)
AST: 61 U/L — ABNORMAL HIGH (ref 15–41)
Albumin: 2.6 g/dL — ABNORMAL LOW (ref 3.5–5.0)
Alkaline Phosphatase: 53 U/L (ref 38–126)
Anion gap: 11 (ref 5–15)
BUN: 15 mg/dL (ref 6–20)
CO2: 22 mmol/L (ref 22–32)
Calcium: 8.7 mg/dL — ABNORMAL LOW (ref 8.9–10.3)
Chloride: 109 mmol/L (ref 98–111)
Creatinine, Ser: 0.77 mg/dL (ref 0.61–1.24)
GFR calc Af Amer: >60 mL/min (ref >60)
GFR calc non Af Amer: >60 mL/min (ref >60)
Glucose, Bld: 83 mg/dL (ref 70–99)
Potassium: 3.7 mmol/L (ref 3.5–5.1)
Sodium: 142 mmol/L (ref 135–145)
Total Bilirubin: 1 mg/dL (ref 0.3–1.2)
Total Protein: 5.4 g/dL — ABNORMAL LOW (ref 6.5–8.1)

## 2019-03-27 LAB — CK: Total CK: 756 U/L — ABNORMAL HIGH (ref 49–397)

## 2019-03-27 LAB — GLUCOSE, CAPILLARY: Glucose-Capillary: 94 mg/dL (ref 70–99)

## 2019-03-27 MED ORDER — NICOTINE 21 MG/24HR TD PT24
21.0000 mg | MEDICATED_PATCH | Freq: Every day | TRANSDERMAL | Status: DC
  Administered 2019-03-27 – 2019-03-28 (×2): 21 mg via TRANSDERMAL
  Filled 2019-03-27 (×2): qty 1

## 2019-03-27 MED ORDER — KETOROLAC TROMETHAMINE 30 MG/ML IJ SOLN
30.0000 mg | Freq: Four times a day (QID) | INTRAMUSCULAR | Status: DC | PRN
  Administered 2019-03-27: 30 mg via INTRAVENOUS
  Filled 2019-03-27: qty 1
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
## 2019-03-27 NOTE — Progress Notes (Signed)
PROGRESS NOTE    Ryan Duffy  ZOX:[id number] DOB: 04-08-1966 DOA: 03/24/2019 PCP: Geronimo Boot, MD    Brief Narrative:  53 y.o. male with medical history significant of anxiety, depression, back pain, GERD, hypertension, renal urolithiasis who is brought to the emergency department via EMS after his roommate called saying that the patient not being in the bathroom for approximately 24 hours.  When EMS arrived, the patient was laying on the bathroom floor with feces and blood on him.  He is unable to provide further information at this time.  I spoke to the patient's mother, who says in the past he had had trouble with substance abuse, but he has never had an instance like this.  She last spoke to him on Sunday.  She states that during the weekend he went out with some friends, but according to her, they do not have any further information.  The patient has been very restless in the emergency department.  He defecated on the bed.  He has been at times swinging his extremities and has nearly hit the staff a few times.  ED Course: Initial vital signs temperature 101.4 F, pulse 132, respiration 19, blood pressure 111/54 mmHg O2 sat 100% on room air.  The patient has been given 3000 mL of NS bolus, lorazepam 2 mg IVP x1 and an initial dose of Zosyn.  I added on another 1000 mL normal saline bolus.  I also had to give him another 2 mg of lorazepam due to continuous restlessness, which was followed by Versed 4 mg IVP.  Later after this, the patient was given Benadryl 25 mg IVP x1.  I started him on four-point soft restraints.  He has had a one-to-one sitter by bedside.  His urinalysis had a cloudy appearance with moderate hemoglobinuria, ketonuria 20 mg/dL and a few bacteria on microscopic examination.  UDS was positive for opiates, but the patient takes oxycodone as needed at home.  CBC showed a white count of 17.6 with 81% neutrophils, hemoglobin 12.7 g/dL and platelets 811.  Lactic acid was normal.   Sodium 141, potassium 6.0, chloride 102 and CO2 16 mmol/L.  His anion gap was 23.  Glucose 101, BUN 76 and creatinine 4.47 mg/dL.  AST was 120 and ALT 63 units/L.  Total bilirubin was mildly elevated at 1.6 units/L.` He had a total CK of 5197 units/L.  Alcohol level was normal.  Acetaminophen and salicylate levels are still pending.  Assessment & Plan:   Principal Problem:   Acute encephalopathy Active Problems:   Rhabdomyolysis   AKI (acute kidney injury) (HCC)   Hyperkalemia   Depression   GERD (gastroesophageal reflux disease)   SIRS (systemic inflammatory response syndrome) (HCC)  Principal Problem:   Acute toxic metabolic encephalopathy -Pt presented with ARF, elevated CK. Unable to obtain full history at this time -From chart review, pt noted to be on muliple depression meds and has hx of depression, thoughts of harming self in the past -ETOH neg. Per history, doubt etoh abuse, thus withdrawal. However, possibility for serotonin syndrome given current home meds. Will need to obtain better history when patient is more awake -Had discussed with patient's mother who is concerned pt may be taking "pills," specifically narcotics given his past drug abuse -Continued on IV fluids. -Given continued fevers, continue empiric broad abx  Active Problems:   SIRS ruled out, likely Serotonin syndrome -No obvious source of infection. -Intermittent fevers noted, currently afebrile -Drug-drug interactions reviewed. Concern for possible serotonin syndrome involving  flexeril and effexor as well as effexor with elavil -Will consult psychiatry with assistance regarding medication adjustment to prevent future drug-drug interactions    Rhabdomyolysis -Continue sodium bicarb infusion given azotemia induced acidosis and hyperkalemia. Monitor intake and output. -Cr normalized -CK had been trending down    AKI (acute kidney injury) (HCC) Continue IV fluids as tolerated Renal function  normalized Repeat bmet in AM    Hyperkalemia  Noted at time of presentation Normalized, recheck bmet in AM    Depression Cont to hold antidepressant per above    GERD (gastroesophageal reflux disease) Protonix 40 mg IVP every 24 hours. Currently stable  DVT prophylaxis: Lovenox subQ Code Status: Full Family Communication: Pt in room, family not at bedside, updated patient's mother 6/3 Disposition Plan: Uncertain at this time  Consultants:   PCCM  Psychiatry  Procedures:     Antimicrobials: Anti-infectives (From admission, onward)   Start     Dose/Rate Route Frequency Ordered Stop   03/25/19 2100  vancomycin (VANCOCIN) IVPB 1000 mg/200 mL premix  Status:  Discontinued     1,000 mg 200 mL/hr over 60 Minutes Intravenous Every 24 hours 03/24/19 2056 03/25/19 1417   03/25/19 2000  vancomycin (VANCOCIN) 1,250 mg in sodium chloride 0.9 % 250 mL IVPB  Status:  Discontinued     1,250 mg 166.7 mL/hr over 90 Minutes Intravenous Every 12 hours 03/25/19 1417 03/27/19 1013   03/25/19 0400  ceFEPIme (MAXIPIME) 2 g in sodium chloride 0.9 % 100 mL IVPB     2 g 200 mL/hr over 30 Minutes Intravenous Every 8 hours 03/24/19 2056     03/24/19 1930  ceFEPIme (MAXIPIME) 2 g in sodium chloride 0.9 % 100 mL IVPB     2 g 200 mL/hr over 30 Minutes Intravenous  Once 03/24/19 1918 03/24/19 2020   03/24/19 1930  metroNIDAZOLE (FLAGYL) IVPB 500 mg  Status:  Discontinued     500 mg 100 mL/hr over 60 Minutes Intravenous Every 8 hours 03/24/19 1918 03/25/19 1121   03/24/19 1930  vancomycin (VANCOCIN) 1,500 mg in sodium chloride 0.9 % 500 mL IVPB     1,500 mg 250 mL/hr over 120 Minutes Intravenous  Once 03/24/19 1918 03/24/19 2237   03/24/19 1630  piperacillin-tazobactam (ZOSYN) IVPB 3.375 g     3.375 g 150 mL/hr over 30 Minutes Intravenous  Once 03/24/19 1616 03/24/19 1812      Subjective: Reports feeling better, mentation and memory improving, slow to respond. No abd or chest  pain  Objective: Vitals:   03/27/19 0000 03/27/19 0009 03/27/19 0500 03/27/19 0800  BP:  135/66    Pulse: 87 81  98  Resp:  17  (!) 25  Temp:  98.6 F (37 C) 97.9 F (36.6 C)   TempSrc:  Oral Oral   SpO2:  99%  (!) 83%    Intake/Output Summary (Last 24 hours) at 03/27/2019 1107 Last data filed at 03/27/2019 6226 Gross per 24 hour  Intake 2225.21 ml  Output --  Net 2225.21 ml   There were no vitals filed for this visit.  Examination: General exam: Awake, laying in bed, in nad, oriented Respiratory system: Normal respiratory effort, no wheezing Cardiovascular system: regular rate, s1, s2 Gastrointestinal system: Soft, nondistended, positive BS Central nervous system: CN2-12 grossly intact, strength intact Extremities: Perfused, no clubbing Skin: Normal skin turgor, no notable skin lesions seen Psychiatry: Mood normal // no visual hallucinations   Data Reviewed: I have personally reviewed following labs and imaging studies  CBC: Recent Labs  Lab 03/24/19 1144 03/25/19 1138 03/26/19 0727 03/27/19 0544  WBC 17.6* 5.9 11.2* 10.8*  NEUTROABS 14.4* 4.6 8.3* 7.7  HGB 12.7* 18.0* 12.0* 11.5*  HCT 38.9* 53.0* 35.3* 34.5*  MCV 92.4 88.5 89.8 90.6  PLT 277 108* 191 182   Basic Metabolic Panel: Recent Labs  Lab 03/24/19 1144 03/24/19 1800 03/25/19 1138 03/26/19 0727 03/27/19 0544  NA 141 142 145 143 142  K 6.0* 5.8* 4.5 3.3* 3.7  CL 102 110 108 106 109  CO2 16* 18* 28 27 22   GLUCOSE 101* 126* 170* 141* 83  BUN 76* 65* 27* 11 15  CREATININE 4.47* 2.51* 0.92 0.76 0.77  CALCIUM 9.3 8.1* 8.3* 8.4* 8.7*   GFR: CrCl cannot be calculated (Unknown ideal weight.). Liver Function Tests: Recent Labs  Lab 03/24/19 1144 03/25/19 1138 03/26/19 0727 03/27/19 0544  AST 120* 101* 75* 61*  ALT 63* 52* 54* 52*  ALKPHOS 54 44 44 53  BILITOT 1.6* 1.1 1.2 1.0  PROT 7.4 5.7* 5.6* 5.4*  ALBUMIN 4.0 2.8* 2.6* 2.6*   No results for input(s): LIPASE, AMYLASE in the last 168  hours. No results for input(s): AMMONIA in the last 168 hours. Coagulation Profile: No results for input(s): INR, PROTIME in the last 168 hours. Cardiac Enzymes: Recent Labs  Lab 03/24/19 1338 03/25/19 1138 03/26/19 0727 03/27/19 0544  CKTOTAL 5,197* 3,850* 1,857* 756*   BNP (last 3 results) No results for input(s): PROBNP in the last 8760 hours. HbA1C: No results for input(s): HGBA1C in the last 72 hours. CBG: Recent Labs  Lab 03/24/19 1209  GLUCAP 102*   Lipid Profile: No results for input(s): CHOL, HDL, LDLCALC, TRIG, CHOLHDL, LDLDIRECT in the last 72 hours. Thyroid Function Tests: No results for input(s): TSH, T4TOTAL, FREET4, T3FREE, THYROIDAB in the last 72 hours. Anemia Panel: No results for input(s): VITAMINB12, FOLATE, FERRITIN, TIBC, IRON, RETICCTPCT in the last 72 hours. Sepsis Labs: Recent Labs  Lab 03/24/19 1218  LATICACIDVEN 1.9    Recent Results (from the past 240 hour(s))  Blood culture (routine x 2)     Status: None (Preliminary result)   Collection Time: 03/24/19 11:50 AM  Result Value Ref Range Status   Specimen Description BLOOD BLOOD RIGHT FOREARM  Final   Special Requests   Final    BOTTLES DRAWN AEROBIC AND ANAEROBIC Blood Culture adequate volume   Culture   Final    NO GROWTH 3 DAYS Performed at [facility], [address] [place], [place] [address]    Report Status PENDING  Incomplete  SARS Coronavirus 2 (CEPHEID- Performed in [facility] lab), Hosp Order     Status: None   Collection Time: 03/24/19 12:21 PM  Result Value Ref Range Status   SARS Coronavirus 2 NEGATIVE NEGATIVE Final    Comment: (NOTE) If result is NEGATIVE SARS-CoV-2 target nucleic acids are NOT DETECTED. The SARS-CoV-2 RNA is generally detectable in upper and lower  respiratory specimens during the acute phase of infection. The lowest  concentration of SARS-CoV-2 viral copies this assay can detect is 250  copies / mL. A negative result does not  preclude SARS-CoV-2 infection  and should not be used as the sole basis for treatment or other  patient management decisions.  A negative result may occur with  improper specimen collection / handling, submission of specimen other  than nasopharyngeal swab, presence of viral mutation(s) within the  areas targeted by this assay, and inadequate number of  viral copies  (<250 copies / mL). A negative result must be combined with clinical  observations, patient history, and epidemiological information. If result is POSITIVE SARS-CoV-2 target nucleic acids are DETECTED. The SARS-CoV-2 RNA is generally detectable in upper and lower  respiratory specimens dur ing the acute phase of infection.  Positive  results are indicative of active infection with SARS-CoV-2.  Clinical  correlation with patient history and other diagnostic information is  necessary to determine patient infection status.  Positive results do  not rule out bacterial infection or co-infection with other viruses. If result is PRESUMPTIVE POSTIVE SARS-CoV-2 nucleic acids MAY BE PRESENT.   A presumptive positive result was obtained on the submitted specimen  and confirmed on repeat testing.  While 2019 novel coronavirus  (SARS-CoV-2) nucleic acids may be present in the submitted sample  additional confirmatory testing may be necessary for epidemiological  and / or clinical management purposes  to differentiate between  SARS-CoV-2 and other Sarbecovirus currently known to infect humans.  If clinically indicated additional testing with an alternate test  methodology [phone]) is advised. The SARS-CoV-2 RNA is generally  detectable in upper and lower respiratory sp ecimens during the acute  phase of infection. The expected result is Negative. Fact Sheet for Patients:  BoilerBrush.com.cy Fact Sheet for Healthcare Providers: https://pope.com/ This test is not yet approved or cleared by  the [place] FDA and has been authorized for detection and/or diagnosis of SARS-CoV-2 by FDA under an Emergency Use Authorization (EUA).  This EUA will remain in effect (meaning this test can be used) for the duration of the COVID-19 declaration under Section 564(b)(1) of the Act, 21 U.S.C. section 360bbb-3(b)(1), unless the authorization is terminated or revoked sooner. Performed at [facility], [address], [place], [place] [address]   Blood culture (routine x 2)     Status: None (Preliminary result)   Collection Time: 03/24/19 10:05 PM  Result Value Ref Range Status   Specimen Description BLOOD RIGHT HAND  Final   Special Requests   Final    BOTTLES DRAWN AEROBIC ONLY Blood Culture results may not be optimal due to an excessive volume of blood received in culture bottles   Culture   Final    NO GROWTH 3 DAYS Performed at [facility], [address], [place], [place] [address]    Report Status PENDING  Incomplete     Radiology Studies: No results found.  Scheduled Meds:  enoxaparin (LOVENOX) injection  40 mg Subcutaneous Q24H   folic acid  1 mg Oral Daily   LORazepam  0-4 mg Intravenous Q12H   multivitamin with minerals  1 tablet Oral Daily   thiamine  100 mg Oral Daily   Or   thiamine  100 mg Intravenous Daily   Continuous Infusions:  ceFEPime (MAXIPIME) IV 2 g (03/27/19 0723)   lactated ringers 75 mL/hr at 03/27/19 4782     LOS: 3 days   Rickey Barbara, MD [facility] Pager On Amion  If 7PM-7AM, please contact night-coverage 03/27/2019, 11:07 AM

## 2019-03-27 NOTE — Progress Notes (Signed)
Patient request a nicotine patch. States he smokes Jewel vape nicotine cigarette. Contacted MD with above and received orders. Will continue to monitor.

## 2019-03-28 ENCOUNTER — Other Ambulatory Visit: Payer: Self-pay

## 2019-03-28 LAB — CBC WITH DIFFERENTIAL/PLATELET
Abs Immature Granulocytes: 0.07 10*3/uL (ref 0.00–0.07)
Basophils Absolute: 0.1 10*3/uL (ref 0.0–0.1)
Basophils Relative: 1 %
Eosinophils Absolute: 0.3 10*3/uL (ref 0.0–0.5)
Eosinophils Relative: 2 %
HCT: 33.7 % — ABNORMAL LOW (ref 39.0–52.0)
Hemoglobin: 11.2 g/dL — ABNORMAL LOW (ref 13.0–17.0)
Immature Granulocytes: 1 %
Lymphocytes Relative: 15 %
Lymphs Abs: 1.9 10*3/uL (ref 0.7–4.0)
MCH: 29.8 pg (ref 26.0–34.0)
MCHC: 33.2 g/dL (ref 30.0–36.0)
MCV: 89.6 fL (ref 80.0–100.0)
Monocytes Absolute: 1.1 10*3/uL — ABNORMAL HIGH (ref 0.1–1.0)
Monocytes Relative: 8 %
Neutro Abs: 9.7 10*3/uL — ABNORMAL HIGH (ref 1.7–7.7)
Neutrophils Relative %: 73 %
Platelets: 178 10*3/uL (ref 150–400)
RBC: 3.76 MIL/uL — ABNORMAL LOW (ref 4.22–5.81)
RDW: 12.8 % (ref 11.5–15.5)
WBC: 13.1 10*3/uL — ABNORMAL HIGH (ref 4.0–10.5)
nRBC: 0 % (ref 0.0–0.2)

## 2019-03-28 LAB — CK: Total CK: 292 U/L (ref 49–397)

## 2019-03-28 MED ORDER — KETOROLAC TROMETHAMINE 10 MG PO TABS: 10.0000 mg | ORAL_TABLET | Freq: Four times a day (QID) | ORAL | 0 refills | Status: AC | PRN

## 2019-03-28 MED ORDER — LOPERAMIDE HCL 2 MG PO CAPS
4.0000 mg | ORAL_CAPSULE | Freq: Once | ORAL | Status: AC
  Administered 2019-03-28: 4 mg via ORAL
  Filled 2019-03-28: qty 2

## 2019-03-28 MED ORDER — ACETAMINOPHEN 325 MG PO TABS: 650.0000 mg | ORAL_TABLET | Freq: Four times a day (QID) | ORAL | Status: AC | PRN

## 2019-03-28 NOTE — Progress Notes (Signed)
Pt discharged via wheelchair with NT 

## 2019-03-28 NOTE — Progress Notes (Signed)
Pt has had small amounts of mucus type diarrhea, 4 times this shift. The last time he had a bowel movement there was small amount maroon color liquid that resembles blood. Patient states this has happened before with antibiotics and is requesting loperamide. Will notify the physician.

## 2019-03-28 NOTE — Progress Notes (Addendum)
Pt states he is missing his cell phone and clothing  RN and NT looked in room Called ED regarding belongings, ED stated they will call back if found Called security, no belonigngs  CN aware  Pt aware

## 2019-03-28 NOTE — Progress Notes (Signed)
Since administration of loperamide patient stated he has had 2 Bowel Movements, no red/maroon color seen, and patient said it looked like it was forming. Will continue to monitor.

## 2019-03-28 NOTE — Discharge Summary (Signed)
Physician Discharge Summary  Ryan Duffy BJY:[id number] DOB: [birth year] DOA: 03/24/2019  PCP: Geronimo Boot, MD  Admit date: 03/24/2019 Discharge date: 03/28/2019  Admitted From: Home Disposition:  Home  Recommendations for Outpatient Follow-up:  1. Follow up with PCP in 1-2 weeks 2. Please resume antidepressant at LOW dose after one week at discretion of PCP and titrate up slowly. Avoid drugs that are related to serotonin syndrome  Discharge Condition:Improved CODE STATUS:Full Diet recommendation: Regular   Brief/Interim Summary: 52 y.o.malewith medical history significant ofanxiety, depression, back pain, GERD, hypertension, renal urolithiasis who is brought to the emergency department via EMS after his roommate called saying that the patient not being in the bathroom for approximately 24 hours. When EMS arrived, the patient was laying on the bathroom floor with feces and blood on him.He is unable to provide further information at this time. I spoke to the patient's mother, who says in the past he had had trouble with substance abuse, but he has never had an instance like this. She last spoke to him on Sunday. She states that during the weekend he went out with some friends, but according to her, they do not have any further information. The patient has been very restless in the emergency department. He defecated on the bed. He has been at times swinging his extremities and has nearly hit the staff a few times.  ED Course:Initial vital signs temperature 101.4 F, pulse 132, respiration 19, blood pressure 111/54 mmHg O2 sat 100% on room air. The patient has been given 3000 mL of NS bolus, lorazepam 2 mg IVP x1 and an initial dose of Zosyn. I added on another 1000 mL normal saline bolus. I also had to give him another 2 mg of lorazepam due to continuous restlessness, which was followed by Versed 4 mg IVP. Later after this, the patient was given Benadryl 25 mg IVP x1. I started  him on four-point soft restraints. He has had a one-to-one sitter by bedside.  His urinalysis had a cloudy appearance with moderate hemoglobinuria, ketonuria 20 mg/dL and a few bacteria on microscopic examination. UDS was positive for opiates, but the patient takes oxycodone as needed at home. CBC showed a white count of 17.6 with 81% neutrophils, hemoglobin 12.7 g/dL and platelets 086. Lactic acid was normal. Sodium 141, potassium 6.0, chloride 102 and CO2 16 mmol/L. His anion gap was 23. Glucose 101, BUN 76 and creatinine 4.47 mg/dL. AST was 120 and ALT 63 units/L. Total bilirubin was mildly elevated at 1.6 units/L.` He had a total CK of 5197 units/L. Alcohol level was normal. Acetaminophen and salicylate levels are still pending.  Discharge Diagnoses:  Principal Problem:   Altered mental status Active Problems:   Acute encephalopathy   Rhabdomyolysis   AKI (acute kidney injury) (HCC)   Hyperkalemia   Depression   GERD (gastroesophageal reflux disease)   SIRS (systemic inflammatory response syndrome) (HCC)  Principal Problem: Acute toxic metabolic encephalopathy -Pt presented with ARF, elevated CK. Unable to obtain full history at this time -From chart review, pt noted to be on muliple depression meds and has hx of depression, thoughts of harming self in the past -Now normalized -See below. Concerns for serotonin syndrome as well as baclofen withdrawals  Active Problems: SIRS ruled out, likely Serotonin syndrome -No obvious source of infection. -Intermittent fevers noted, currently afebrile -Drug-drug interactions reviewed. Concern for possible serotonin syndrome involving flexeril and effexor as well as effexor with elavil -Appreciate input by Psychiatry. Concerns for baclofen withdrawals as  pt was takin baclofen recently for back pain. Also Psychiatry shares concerns regarding possible serotonin syndrome -Psychiatry recommends holding all meds related to serotonin  syndrome x 1 week, after which pt could resume, albeit at much lower dose and gradually increase over time as tolerated  Rhabdomyolysis -Continue sodium bicarb infusion given azotemia induced acidosis and hyperkalemia. Monitor intake and output. -Cr normalized -CK had been trending down  AKI (acute kidney injury) (HCC) Continue IV fluids as tolerated Renal function normalized  Hyperkalemia  Noted at time of presentation Normalized  Depression Cont to hold antidepressant per above  GERD (gastroesophageal reflux disease) stable   Discharge Instructions   Allergies as of 03/28/2019      Reactions   Ivp Dye [iodinated Diagnostic Agents] Anaphylaxis   Sulfamethoxazole Rash   Other reaction(s): Other (See Comments) [Rash] burning blisters   Tramadol Nausea And Vomiting   Sulfa Antibiotics Rash      Medication List    STOP taking these medications   amitriptyline 50 MG tablet Commonly known as:  ELAVIL   baclofen 10 MG tablet Commonly known as:  LIORESAL   clonazePAM 0.5 MG tablet Commonly known as:  KLONOPIN   cyclobenzaprine 5 MG tablet Commonly known as:  FLEXERIL   diclofenac 75 MG EC tablet Commonly known as:  VOLTAREN   lisinopril 10 MG tablet Commonly known as:  ZESTRIL   meloxicam 15 MG tablet Commonly known as:  Mobic   Oxycodone HCl 10 MG Tabs   venlafaxine XR 150 MG 24 hr capsule Commonly known as:  EFFEXOR-XR   venlafaxine XR 75 MG 24 hr capsule Commonly known as:  EFFEXOR-XR     TAKE these medications   acetaminophen 325 MG tablet Commonly known as:  TYLENOL Take 2 tablets (650 mg total) by mouth every 6 (six) hours as needed for mild pain (or Fever >/= 101).   ketorolac 10 MG tablet Commonly known as:  TORADOL Take 1 tablet (10 mg total) by mouth every 6 (six) hours as needed.      Follow-up Information    Geronimo Boothornton, Heath, MD. Schedule an appointment as soon as possible for a visit in 1 week(s).   Specialty:   Family Medicine         Allergies  Allergen Reactions  . Ivp Dye [Iodinated Diagnostic Agents] Anaphylaxis  . Sulfamethoxazole Rash    Other reaction(s): Other (See Comments) [Rash] burning blisters  . Tramadol Nausea And Vomiting  . Sulfa Antibiotics Rash    Consultations:  PCCM  Psychiatry  Procedures/Studies: Ct Head Wo Contrast  Result Date: 03/24/2019 CLINICAL DATA:  Encephalopathy. EXAM: CT HEAD WITHOUT CONTRAST TECHNIQUE: Contiguous axial images were obtained from the base of the skull through the vertex without intravenous contrast. COMPARISON:  04/10/2017 FINDINGS: Brain: Normal. Vascular: No hyperdense vessel or unexpected calcification. Skull: Normal. Sinuses/Orbits: Normal. Other: None IMPRESSION: Normal exam. Electronically Signed   By: Francene BoyersJames  Maxwell M.D.   On: 03/24/2019 15:08   Dg Chest Portable 1 View  Result Date: 03/24/2019 CLINICAL DATA:  Fevers, altered mental status EXAM: PORTABLE CHEST 1 VIEW COMPARISON:  04/10/2017 FINDINGS: The heart size and mediastinal contours are within normal limits. Both lungs are clear. The visualized skeletal structures are unremarkable. IMPRESSION: No active disease. Electronically Signed   By: Elige KoHetal  Patel   On: 03/24/2019 12:46     Subjective: Very eager to go home  Discharge Exam: Vitals:   03/28/19 0600 03/28/19 0800  BP: (!) 141/85 (!) 151/94  Pulse: (!) 101  96  Resp: 19   Temp: 98.9 F (37.2 C)   SpO2: 100%    Vitals:   03/28/19 0000 03/28/19 0600 03/28/19 0800 03/28/19 1057  BP: (!) 147/64 (!) 141/85 (!) 151/94   Pulse: 99 (!) 101 96   Resp: 20 19    Temp: 98.7 F (37.1 C) 98.9 F (37.2 C)    TempSrc:  Oral    SpO2: 100% 100%    Weight:    79.4 kg  Height:    5\' 10"  (1.778 m)    General: Pt is alert, awake, not in acute distress Cardiovascular: RRR, S1/S2 +, no rubs, no gallops Respiratory: CTA bilaterally, no wheezing, no rhonchi Abdominal: Soft, NT, ND, bowel sounds + Extremities: no edema, no  cyanosis   The results of significant diagnostics from this hospitalization (including imaging, microbiology, ancillary and laboratory) are listed below for reference.     Microbiology: Recent Results (from the past 240 hour(s))  Blood culture (routine x 2)     Status: None (Preliminary result)   Collection Time: 03/24/19 11:50 AM  Result Value Ref Range Status   Specimen Description BLOOD BLOOD RIGHT FOREARM  Final   Special Requests   Final    BOTTLES DRAWN AEROBIC AND ANAEROBIC Blood Culture adequate volume   Culture   Final    NO GROWTH 4 DAYS Performed at [facility], [address] [place], [place] [address]    Report Status PENDING  Incomplete  SARS Coronavirus 2 (CEPHEID- Performed in [facility] hospital lab), Hosp Order     Status: None   Collection Time: 03/24/19 12:21 PM  Result Value Ref Range Status   SARS Coronavirus 2 NEGATIVE NEGATIVE Final    Comment: (NOTE) If result is NEGATIVE SARS-CoV-2 target nucleic acids are NOT DETECTED. The SARS-CoV-2 RNA is generally detectable in upper and lower  respiratory specimens during the acute phase of infection. The lowest  concentration of SARS-CoV-2 viral copies this assay can detect is 250  copies / mL. A negative result does not preclude SARS-CoV-2 infection  and should not be used as the sole basis for treatment or other  patient management decisions.  A negative result may occur with  improper specimen collection / handling, submission of specimen other  than nasopharyngeal swab, presence of viral mutation(s) within the  areas targeted by this assay, and inadequate number of viral copies  (<250 copies / mL). A negative result must be combined with clinical  observations, patient history, and epidemiological information. If result is POSITIVE SARS-CoV-2 target nucleic acids are DETECTED. The SARS-CoV-2 RNA is generally detectable in upper and lower  respiratory specimens dur ing the acute phase of infection.   Positive  results are indicative of active infection with SARS-CoV-2.  Clinical  correlation with patient history and other diagnostic information is  necessary to determine patient infection status.  Positive results do  not rule out bacterial infection or co-infection with other viruses. If result is PRESUMPTIVE POSTIVE SARS-CoV-2 nucleic acids MAY BE PRESENT.   A presumptive positive result was obtained on the submitted specimen  and confirmed on repeat testing.  While 2019 novel coronavirus  (SARS-CoV-2) nucleic acids may be present in the submitted sample  additional confirmatory testing may be necessary for epidemiological  and / or clinical management purposes  to differentiate between  SARS-CoV-2 and other Sarbecovirus currently known to infect humans.  If clinically indicated additional testing with an alternate test  methodology [phone]) is advised. The SARS-CoV-2  RNA is generally  detectable in upper and lower respiratory sp ecimens during the acute  phase of infection. The expected result is Negative. Fact Sheet for Patients:  BoilerBrush.com.cyhttps://www.fda.gov/media/136312/download Fact Sheet for Healthcare Providers: https://pope.com/https://www.fda.gov/media/136313/download This test is not yet approved or cleared by the [place] FDA and has been authorized for detection and/or diagnosis of SARS-CoV-2 by FDA under an Emergency Use Authorization (EUA).  This EUA will remain in effect (meaning this test can be used) for the duration of the COVID-19 declaration under Section 564(b)(1) of the Act, 21 U.S.C. section 360bbb-3(b)(1), unless the authorization is terminated or revoked sooner. Performed at [facility], [address] [place], [place] [address]   Blood culture (routine x 2)     Status: None (Preliminary result)   Collection Time: 03/24/19 10:05 PM  Result Value Ref Range Status   Specimen Description BLOOD RIGHT HAND  Final   Special Requests   Final    BOTTLES DRAWN AEROBIC  ONLY Blood Culture results may not be optimal due to an excessive volume of blood received in culture bottles   Culture   Final    NO GROWTH 4 DAYS Performed at [facility], [address] [place], [place] [address]    Report Status PENDING  Incomplete     Labs: BNP (last 3 results) No results for input(s): BNP in the last 8760 hours. Basic Metabolic Panel: Recent Labs  Lab 03/24/19 1144 03/24/19 1800 03/25/19 1138 03/26/19 0727 03/27/19 0544  NA 141 142 145 143 142  K 6.0* 5.8* 4.5 3.3* 3.7  CL 102 110 108 106 109  CO2 16* 18* 28 27 22   GLUCOSE 101* 126* 170* 141* 83  BUN 76* 65* 27* 11 15  CREATININE 4.47* 2.51* 0.92 0.76 0.77  CALCIUM 9.3 8.1* 8.3* 8.4* 8.7*   Liver Function Tests: Recent Labs  Lab 03/24/19 1144 03/25/19 1138 03/26/19 0727 03/27/19 0544  AST 120* 101* 75* 61*  ALT 63* 52* 54* 52*  ALKPHOS 54 44 44 53  BILITOT 1.6* 1.1 1.2 1.0  PROT 7.4 5.7* 5.6* 5.4*  ALBUMIN 4.0 2.8* 2.6* 2.6*   No results for input(s): LIPASE, AMYLASE in the last 168 hours. No results for input(s): AMMONIA in the last 168 hours. CBC: Recent Labs  Lab 03/24/19 1144 03/25/19 1138 03/26/19 0727 03/27/19 0544 03/28/19 0246  WBC 17.6* 5.9 11.2* 10.8* 13.1*  NEUTROABS 14.4* 4.6 8.3* 7.7 9.7*  HGB 12.7* 18.0* 12.0* 11.5* 11.2*  HCT 38.9* 53.0* 35.3* 34.5* 33.7*  MCV 92.4 88.5 89.8 90.6 89.6  PLT 277 108* 191 182 178   Cardiac Enzymes: Recent Labs  Lab 03/24/19 1338 03/25/19 1138 03/26/19 0727 03/27/19 0544 03/28/19 0246  CKTOTAL 5,197* 3,850* 1,857* 756* 292   BNP: Invalid input(s): POCBNP CBG: Recent Labs  Lab 03/24/19 1209 03/27/19 2245  GLUCAP 102* 94   D-Dimer No results for input(s): DDIMER in the last 72 hours. Hgb A1c No results for input(s): HGBA1C in the last 72 hours. Lipid Profile No results for input(s): CHOL, HDL, LDLCALC, TRIG, CHOLHDL, LDLDIRECT in the last 72 hours. Thyroid function studies No results for input(s): TSH,  T4TOTAL, T3FREE, THYROIDAB in the last 72 hours.  Invalid input(s): FREET3 Anemia work up No results for input(s): VITAMINB12, FOLATE, FERRITIN, TIBC, IRON, RETICCTPCT in the last 72 hours. Urinalysis    Component Value Date/Time   COLORURINE YELLOW 03/24/2019 1144   APPEARANCEUR CLOUDY (A) 03/24/2019 1144   LABSPEC 1.016 03/24/2019 1144  PHURINE 5.0 03/24/2019 1144   GLUCOSEU NEGATIVE 03/24/2019 1144   HGBUR MODERATE (A) 03/24/2019 1144   BILIRUBINUR NEGATIVE 03/24/2019 1144   KETONESUR 20 (A) 03/24/2019 1144   PROTEINUR NEGATIVE 03/24/2019 1144   UROBILINOGEN 0.2 05/21/2016 1642   NITRITE NEGATIVE 03/24/2019 1144   LEUKOCYTESUR NEGATIVE 03/24/2019 1144   Sepsis Labs Invalid input(s): PROCALCITONIN,  WBC,  LACTICIDVEN Microbiology Recent Results (from the past 240 hour(s))  Blood culture (routine x 2)     Status: None (Preliminary result)   Collection Time: 03/24/19 11:50 AM  Result Value Ref Range Status   Specimen Description BLOOD BLOOD RIGHT FOREARM  Final   Special Requests   Final    BOTTLES DRAWN AEROBIC AND ANAEROBIC Blood Culture adequate volume   Culture   Final    NO GROWTH 4 DAYS Performed at [facility], [address], [place], [place] [address]    Report Status PENDING  Incomplete  SARS Coronavirus 2 (CEPHEID- Performed in [facility] hospital lab), Hosp Order     Status: None   Collection Time: 03/24/19 12:21 PM  Result Value Ref Range Status   SARS Coronavirus 2 NEGATIVE NEGATIVE Final    Comment: (NOTE) If result is NEGATIVE SARS-CoV-2 target nucleic acids are NOT DETECTED. The SARS-CoV-2 RNA is generally detectable in upper and lower  respiratory specimens during the acute phase of infection. The lowest  concentration of SARS-CoV-2 viral copies this assay can detect is 250  copies / mL. A negative result does not preclude SARS-CoV-2 infection  and should not be used as the sole basis for treatment or other  patient management decisions.  A  negative result may occur with  improper specimen collection / handling, submission of specimen other  than nasopharyngeal swab, presence of viral mutation(s) within the  areas targeted by this assay, and inadequate number of viral copies  (<250 copies / mL). A negative result must be combined with clinical  observations, patient history, and epidemiological information. If result is POSITIVE SARS-CoV-2 target nucleic acids are DETECTED. The SARS-CoV-2 RNA is generally detectable in upper and lower  respiratory specimens dur ing the acute phase of infection.  Positive  results are indicative of active infection with SARS-CoV-2.  Clinical  correlation with patient history and other diagnostic information is  necessary to determine patient infection status.  Positive results do  not rule out bacterial infection or co-infection with other viruses. If result is PRESUMPTIVE POSTIVE SARS-CoV-2 nucleic acids MAY BE PRESENT.   A presumptive positive result was obtained on the submitted specimen  and confirmed on repeat testing.  While 2019 novel coronavirus  (SARS-CoV-2) nucleic acids may be present in the submitted sample  additional confirmatory testing may be necessary for epidemiological  and / or clinical management purposes  to differentiate between  SARS-CoV-2 and other Sarbecovirus currently known to infect humans.  If clinically indicated additional testing with an alternate test  methodology [phone]) is advised. The SARS-CoV-2 RNA is generally  detectable in upper and lower respiratory sp ecimens during the acute  phase of infection. The expected result is Negative. Fact Sheet for Patients:  BoilerBrush.com.cy Fact Sheet for Healthcare Providers: https://pope.com/ This test is not yet approved or cleared by the [place] FDA and has been authorized for detection and/or diagnosis of SARS-CoV-2 by FDA under an Emergency Use  Authorization (EUA).  This EUA will remain in effect (meaning this test can be used) for the duration of the COVID-19 declaration under Section 564(b)(1)  of the Act, 21 U.S.C. section 360bbb-3(b)(1), unless the authorization is terminated or revoked sooner. Performed at [facility], [place] [address], [place], [place] [address]   Blood culture (routine x 2)     Status: None (Preliminary result)   Collection Time: 03/24/19 10:05 PM  Result Value Ref Range Status   Specimen Description BLOOD RIGHT HAND  Final   Special Requests   Final    BOTTLES DRAWN AEROBIC ONLY Blood Culture results may not be optimal due to an excessive volume of blood received in culture bottles   Culture   Final    NO GROWTH 4 DAYS Performed at [facility], [place] [address], [place], [place] [address]    Report Status PENDING  Incomplete   Time spent: 30 min  SIGNED:   Marylu Lund, MD  [facility] 03/28/2019, 11:57 AM  If 7PM-7AM, please contact night-coverage

## 2019-03-28 NOTE — Progress Notes (Signed)
Pt IV removed, catheter intact and telemetry removed. Pt has watch and glasses. Discharge education went over with pt at bedside. Awaiting family for transportation

## 2019-03-29 LAB — CULTURE, BLOOD (ROUTINE X 2)
Culture: NO GROWTH
Culture: NO GROWTH
Special Requests: ADEQUATE

## 2020-08-02 IMAGING — DX PORTABLE CHEST - 1 VIEW
1 series · 1 of 1 positions shown · non-contrast
Comparison: 04/10/2017

CLINICAL DATA: Fevers, altered mental status

EXAM:
PORTABLE CHEST 1 VIEW

[chest ap]
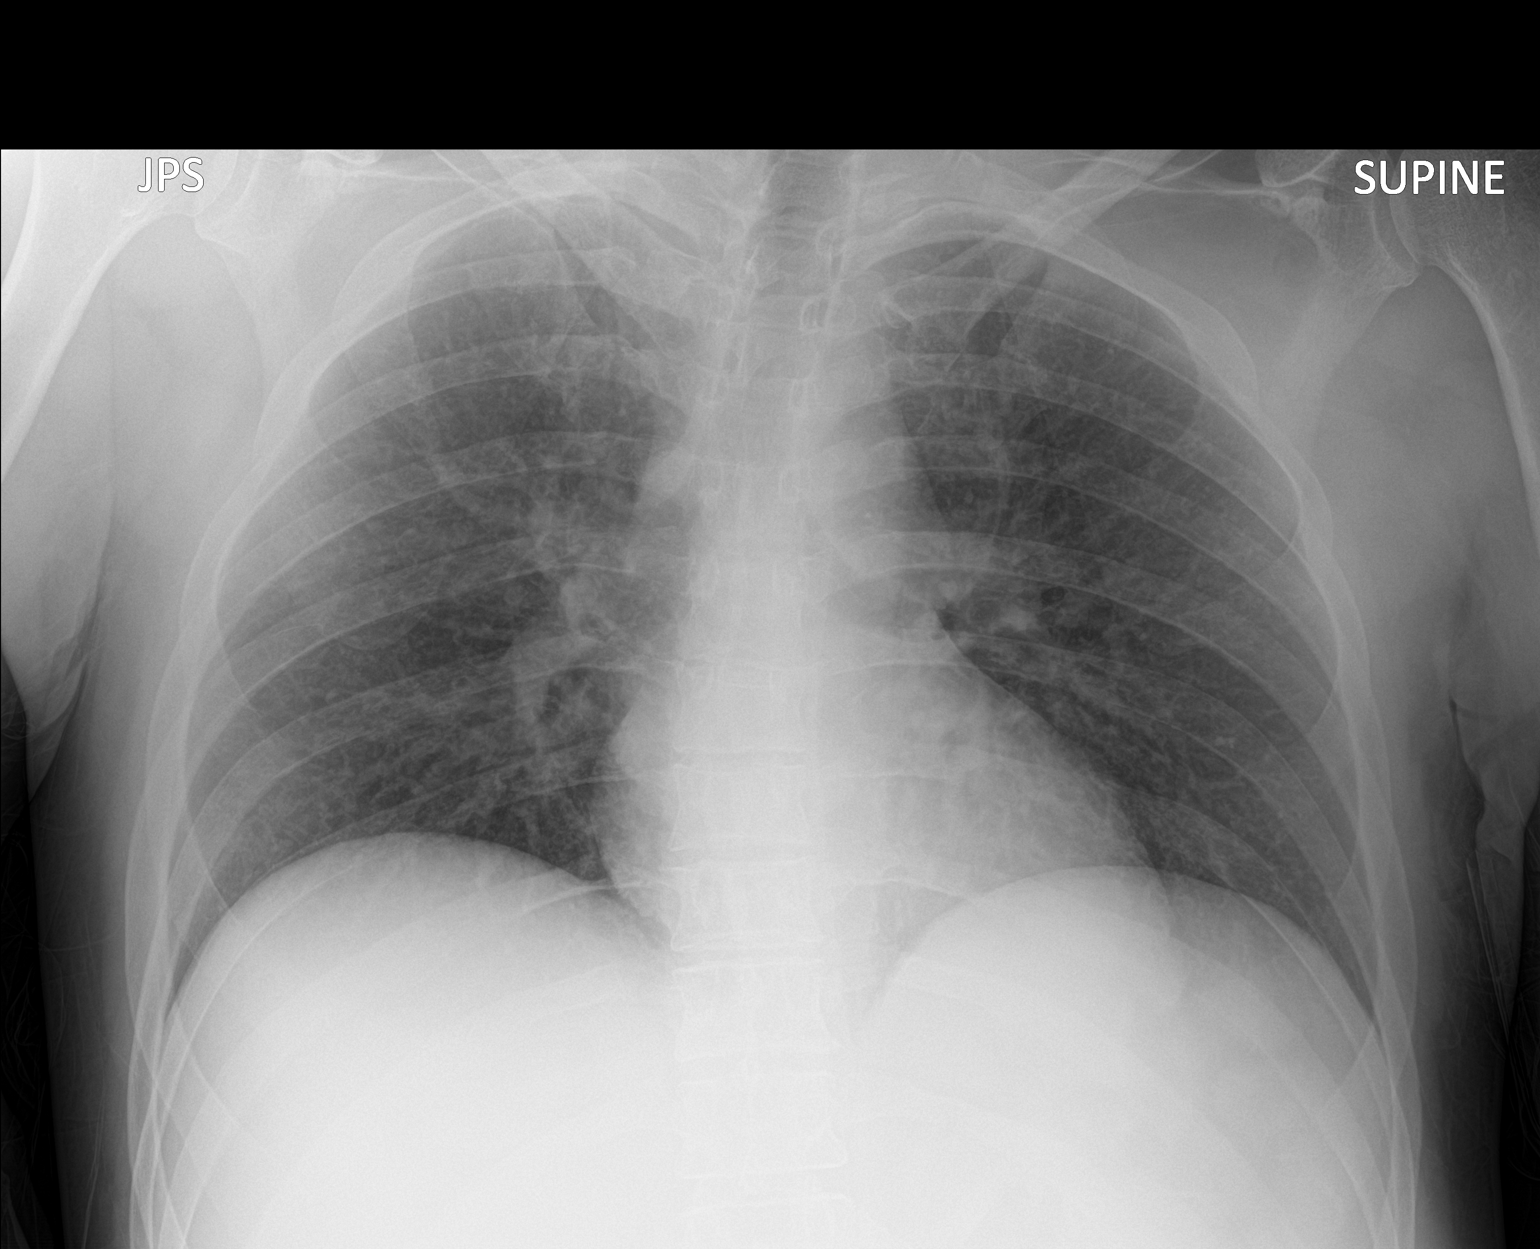

[1 of 1 positions shown; findings below may reference images not displayed]

FINDINGS: The heart size and mediastinal contours are within normal limits.
Both lungs are clear. The visualized skeletal structures are
unremarkable.
IMPRESSION: No active disease.

## 2020-08-02 IMAGING — CT CT HEAD WITHOUT CONTRAST
4 series · 17 of 47 positions shown, 19 images · non-contrast
Comparison: 04/10/2017

CLINICAL DATA: Encephalopathy.

EXAM:
CT HEAD WITHOUT CONTRAST
TECHNIQUE: Contiguous axial images were obtained from the base of the skull
through the vertex without intravenous contrast.

[Series 2: head without · axial · non-contrast · 0.41mm/px · z∈[-79,+56]mm · 7 of 37 slices shown, 9 images]
[im 5/37  brain]
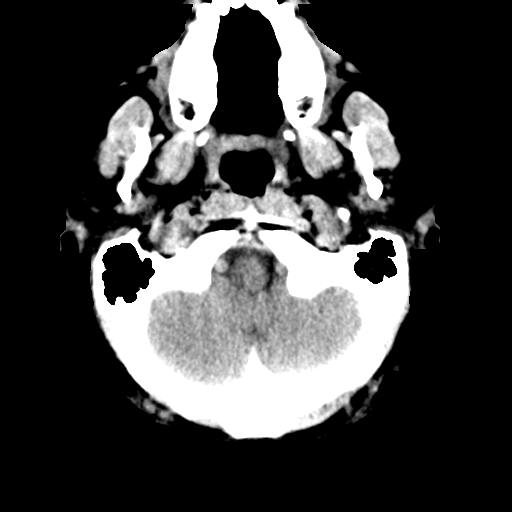
[im 5/37  bone]
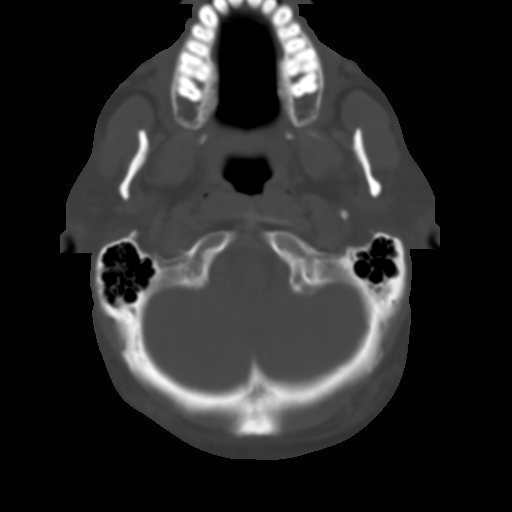
[im 10/37  brain]
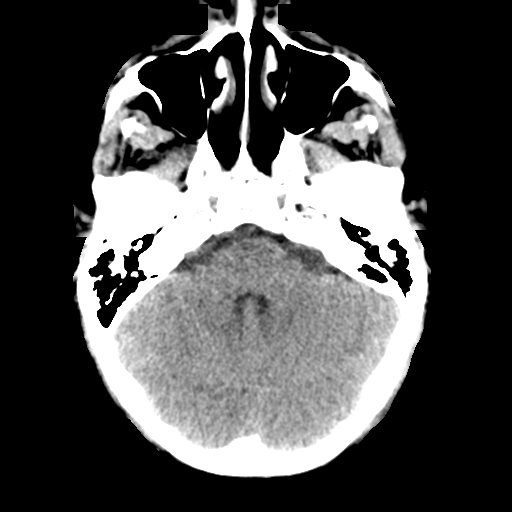
[im 14/37  brain]
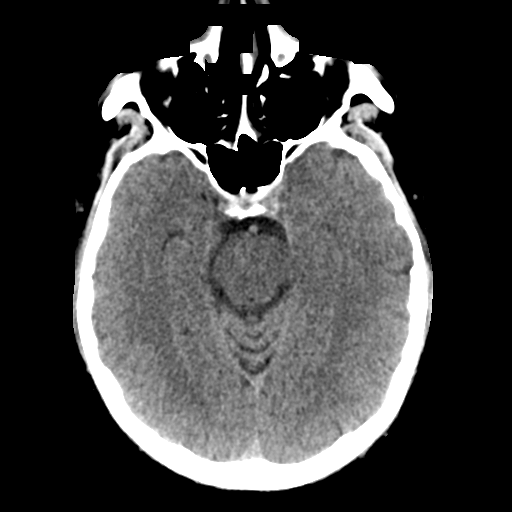
[im 19/37  brain]
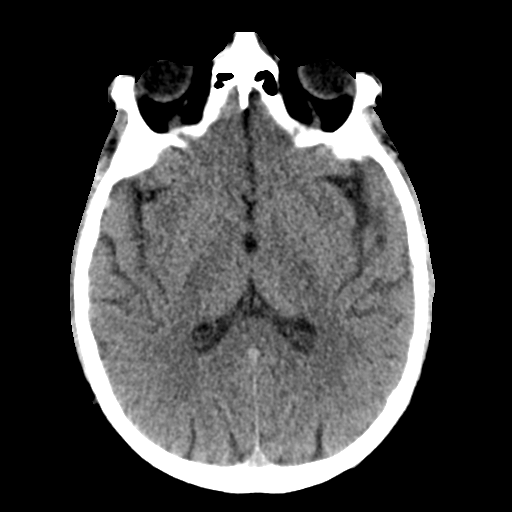
[im 23/37  brain]
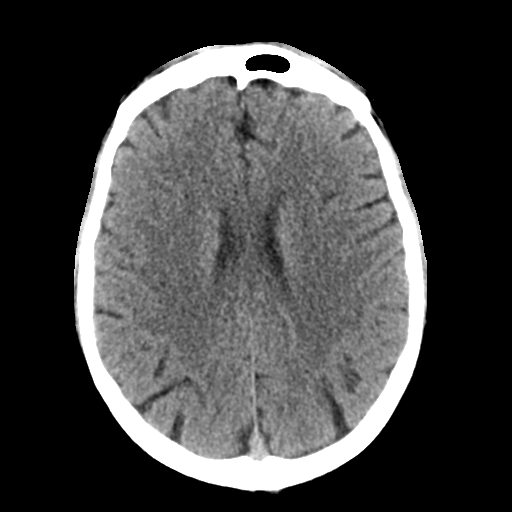
[im 23/37  bone]
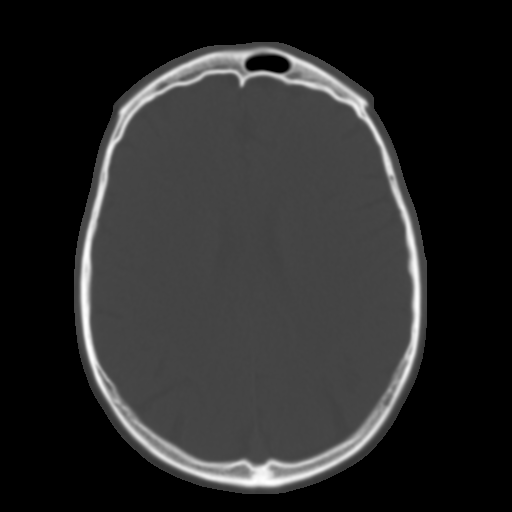
[im 28/37  brain]
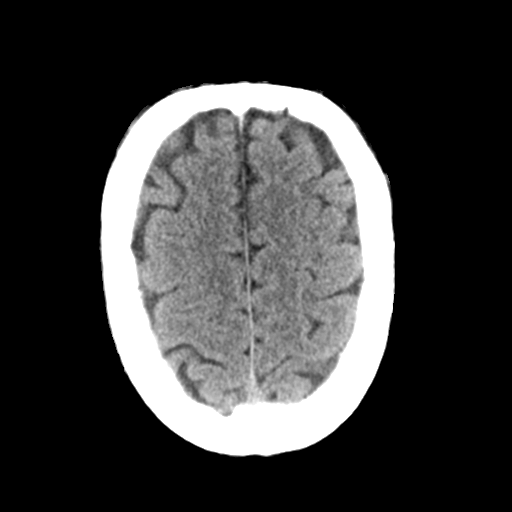
[im 32/37  brain]
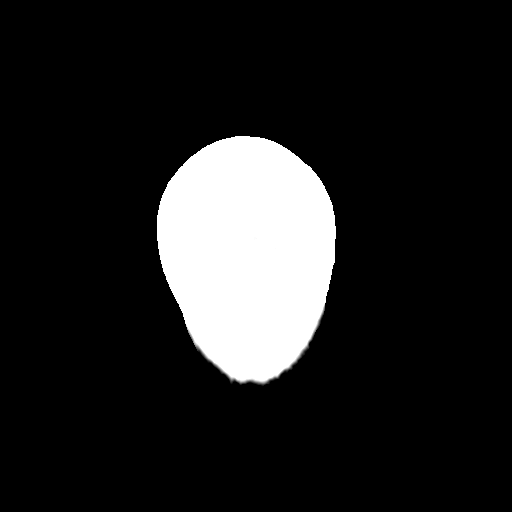

[Series 3: head bone · axial · 0.41mm/px · z∈[-81,-19]mm · 4 of 92 slices shown]
[im 10/92  bone]
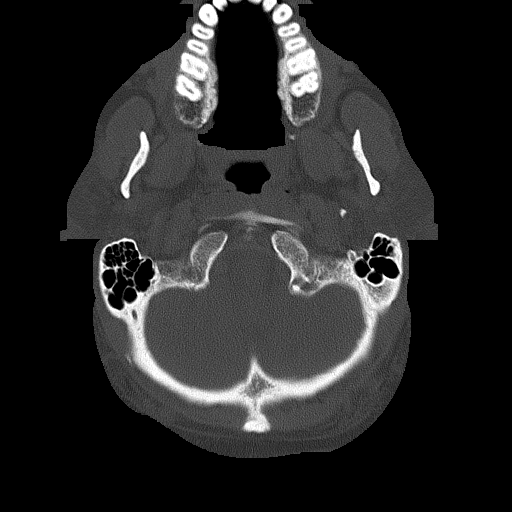
[im 19/92  bone]
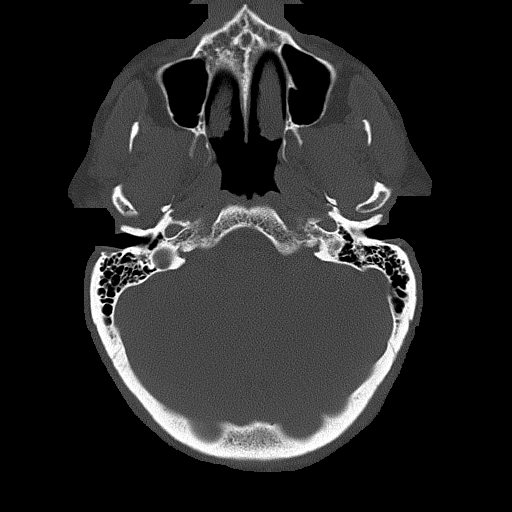
[im 28/92  bone]
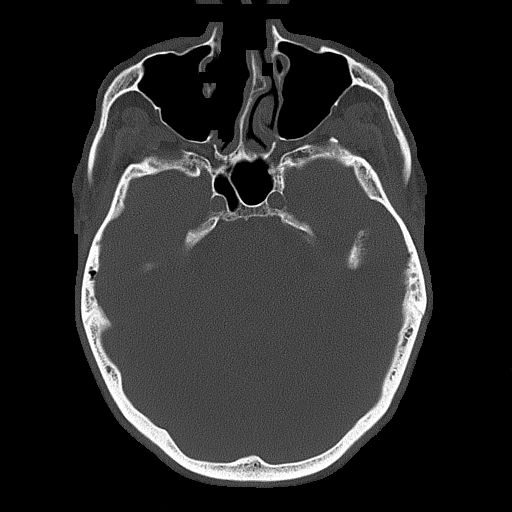
[im 41/92  bone]
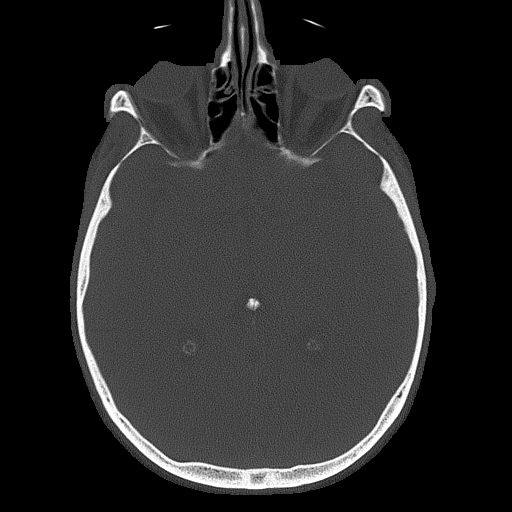

[Series 4: head without cor · coronal · non-contrast · 0.35mm/px · 3 of 70 slices shown]
[im 24/70  brain]
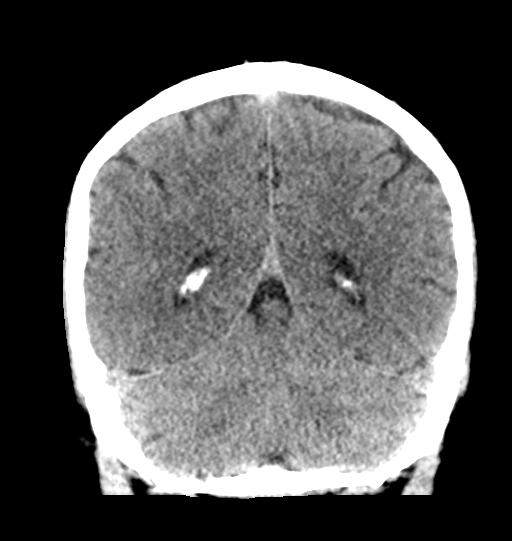
[im 31/70  brain]
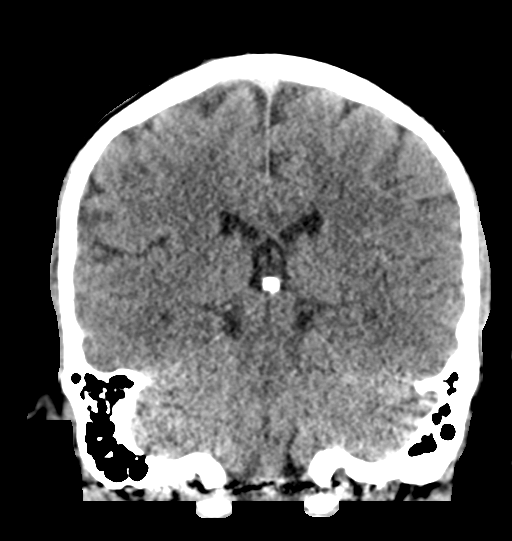
[im 39/70  brain]
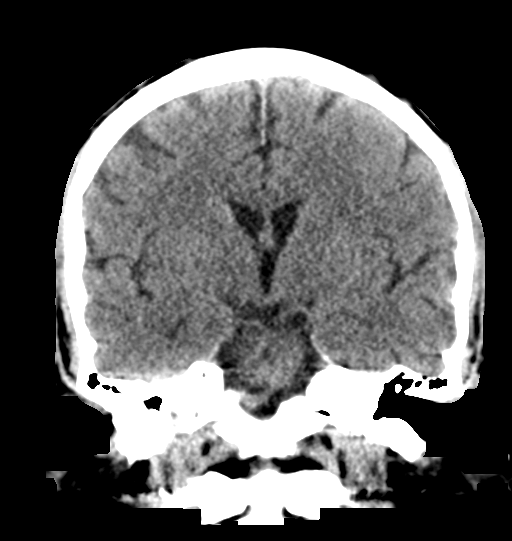

[Series 5: head without sag · sagittal · non-contrast · 0.36mm/px · 3 of 61 slices shown]
[im 21/61  brain]
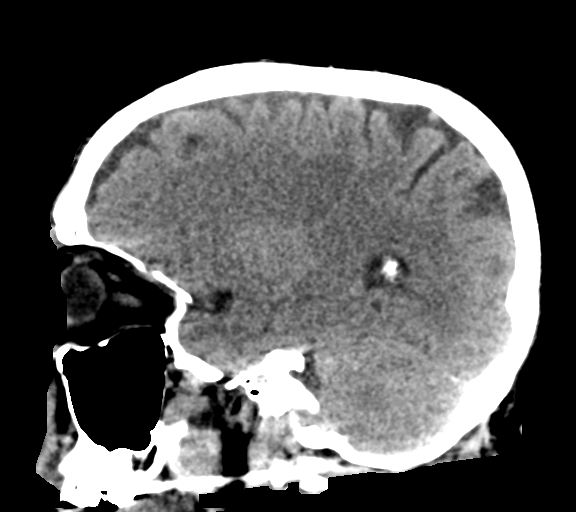
[im 31/61  brain]
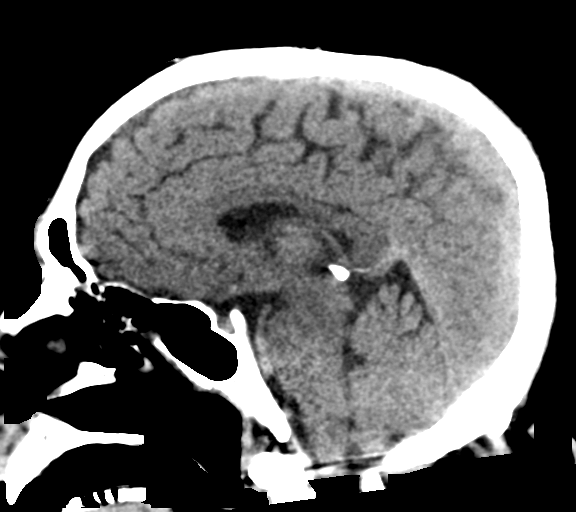
[im 41/61  brain]
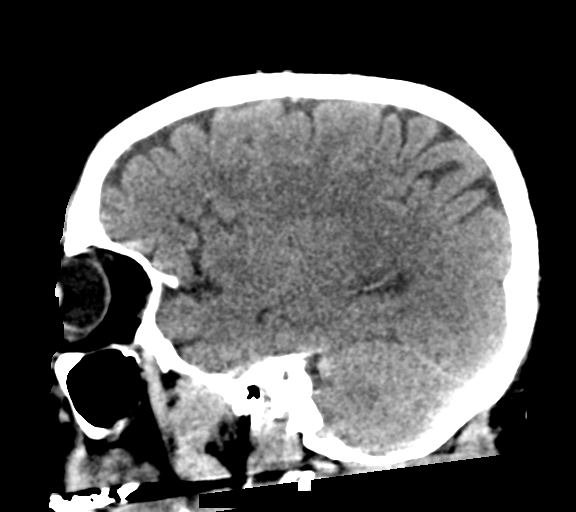

[17 of 47 positions shown; findings below may reference images not displayed]

FINDINGS: Brain: Normal.

Vascular: No hyperdense vessel or unexpected calcification.

Skull: Normal.

Sinuses/Orbits: Normal.

Other: None
IMPRESSION: Normal exam.
